# Patient Record
Sex: Female | Born: 1970 | Race: Black or African American | Hispanic: No | Smoking: Never smoker
Health system: Southern US, Community
[De-identification: ages and names within clinical notes are randomized; demographics above are authoritative.]

## PROBLEM LIST (undated history)

## (undated) DIAGNOSIS — N200 Calculus of kidney: Secondary | ICD-10-CM

## (undated) DIAGNOSIS — E119 Type 2 diabetes mellitus without complications: Secondary | ICD-10-CM

## (undated) DIAGNOSIS — M1990 Unspecified osteoarthritis, unspecified site: Secondary | ICD-10-CM

## (undated) DIAGNOSIS — K7689 Other specified diseases of liver: Secondary | ICD-10-CM

## (undated) DIAGNOSIS — I10 Essential (primary) hypertension: Secondary | ICD-10-CM

## (undated) DIAGNOSIS — K8000 Calculus of gallbladder with acute cholecystitis without obstruction: Secondary | ICD-10-CM

## (undated) DIAGNOSIS — Z87442 Personal history of urinary calculi: Secondary | ICD-10-CM

## (undated) HISTORY — PX: KNEE SURGERY: SHX244

## (undated) HISTORY — DX: Other specified diseases of liver: K76.89

## (undated) HISTORY — DX: Calculus of kidney: N20.0

## (undated) HISTORY — PX: ABDOMINAL HYSTERECTOMY: SHX81

## (undated) HISTORY — DX: Calculus of gallbladder with acute cholecystitis without obstruction: K80.00

---

## 2001-07-29 ENCOUNTER — Emergency Department (HOSPITAL_COMMUNITY): Admission: EM | Admit: 2001-07-29 | Discharge: 2001-07-30 | Payer: Self-pay | Admitting: Emergency Medicine

## 2002-02-26 ENCOUNTER — Ambulatory Visit (HOSPITAL_COMMUNITY): Admission: RE | Admit: 2002-02-26 | Discharge: 2002-02-26 | Payer: Self-pay | Admitting: Specialist

## 2003-07-18 ENCOUNTER — Emergency Department (HOSPITAL_COMMUNITY): Admission: EM | Admit: 2003-07-18 | Discharge: 2003-07-18 | Payer: Self-pay | Admitting: Emergency Medicine

## 2003-07-20 ENCOUNTER — Emergency Department (HOSPITAL_COMMUNITY): Admission: EM | Admit: 2003-07-20 | Discharge: 2003-07-20 | Payer: Self-pay | Admitting: Emergency Medicine

## 2003-12-26 ENCOUNTER — Emergency Department (HOSPITAL_COMMUNITY): Admission: EM | Admit: 2003-12-26 | Discharge: 2003-12-27 | Payer: Self-pay | Admitting: Emergency Medicine

## 2003-12-31 ENCOUNTER — Other Ambulatory Visit: Admission: RE | Admit: 2003-12-31 | Discharge: 2003-12-31 | Payer: Self-pay | Admitting: Obstetrics and Gynecology

## 2004-01-16 ENCOUNTER — Inpatient Hospital Stay (HOSPITAL_COMMUNITY): Admission: RE | Admit: 2004-01-16 | Discharge: 2004-01-18 | Payer: Self-pay | Admitting: Obstetrics and Gynecology

## 2004-01-16 ENCOUNTER — Encounter (INDEPENDENT_AMBULATORY_CARE_PROVIDER_SITE_OTHER): Payer: Self-pay | Admitting: Specialist

## 2007-02-23 ENCOUNTER — Emergency Department (HOSPITAL_COMMUNITY): Admission: EM | Admit: 2007-02-23 | Discharge: 2007-02-23 | Payer: Self-pay | Admitting: Emergency Medicine

## 2011-02-11 NOTE — Op Note (Signed)
[facility]  Patient:    Amanda Kirk, Amanda Kirk Visit Number: [id number] MRN: [id number]          Service Type: DSU Location: DAY Attending Physician:  Rocky Crafts Dictated by:   Michael Litter. Montez Morita, M.D. Proc. Date: 02/26/02 Admit Date:  02/26/2002                             Operative Report  PREOPERATIVE DIAGNOSIS:  Torn medial meniscus.  POSTOPERATIVE DIAGNOSIS:  Torn medial meniscus.  OPERATIVE PROCEDURE:  Partial medial meniscectomy.  SURGEON:  Philips J. Montez Morita, M.D.  DESCRIPTION OF PROCEDURE:  After suitable local anesthesia with sedation, the knee was prepped and draped routinely.  Arthroscopy is carried out with an anterolateral portal and the inflow brought in through the scope.  Pertinent pathology is confined to the medial joint where there was a degenerative flap tear on an acute flap tear of the posterior horn of the medial meniscus and two flaps, one tucked up underneath probably causing most of the discomfort, and removed with a combination of rotary meniscotome and the punch forceps until a nice smooth edge remained.  Then, 20 cc of 0.5% Marcaine in the knee and nice pressure dressings, and she goes to recovery in good condition. Dictated by:   [employer] Montez Morita, M.D. Attending Physician:  Rocky Crafts DD:  02/26/02 TD:  02/27/02 Job: [id number]

## 2011-02-11 NOTE — Op Note (Signed)
NAMEGUSTAVA, BERLAND NO.:  [id number]   MEDICAL RECORD NO.:  [id number]                   PATIENT TYPE:  INP   LOCATION:  9307                                 FACILITY:  [facility]   PHYSICIAN:  Carrington Clamp, M.D.              DATE OF BIRTH:  [birth year]   DATE OF PROCEDURE:  01/16/2004  DATE OF DISCHARGE:                                 OPERATIVE REPORT   PREOPERATIVE DIAGNOSES:  1. Menorrhagia with markedly enlarged fibroid uterus.  2. Possible left ovarian dermoid cyst.   POSTOPERATIVE DIAGNOSES:  1. Menorrhagia with markedly enlarged fibroid uterus.  2. Normal left ovarian follicular cysts.   PROCEDURE:  Total abdominal hysterectomy.   ATTENDING:  Carrington Clamp, M.D.   ASSISTANT:  Luvenia Redden, M.D.   ANESTHESIA:  General endotracheal anesthesia.   ESTIMATED BLOOD LOSS:  275 mL.   FLUIDS REPLACED:  2000 mL.   URINE OUTPUT:  200 mL and clear.   COMPLICATIONS:  None.   FINDINGS:  About a 16-week size uterus with multiple rather large fibroids.  There is a small 2-3 cm fibroid in the broad ligament on the left-hand side  close to the ovary, which may have been what had been identified as a  possible dermoid.  The ureters were identified bilaterally.   MEDICATIONS:  Ancef and 0.25% Marcaine.   PATHOLOGY:  Uterus and cervix.   COUNTS:  Correct x3.   TECHNIQUE:  After adequate general anesthesia was achieved, the patient was  prepped and draped in the usual sterile fashion in the dorsal supine  position.  A Pfannenstiel skin incision was made with a scalpel, carried  down to the fascia with Bovie cautery.  The fascia is incised in the midline  with the scalpel and carried in a transverse curvilinear manner with the  Mayo scissors.  The fascia was reflected superiorly and inferiorly from the  rectus muscles.  The rectus muscles were split in the midline.  A bowel-free  portion of peritoneum was entered into bluntly and the  peritoneum was  incised in a superior and inferior manner with the Metzenbaum scissors.   The bowel was packed away and the Balfour retractor was placed.  The uterus  was identified and the round ligaments secured with suture transfixion  stitches of 0 Vicryl.  Each pedicle was incised with the Bovie cautery,  which then also incised the anterior leaf of the broad ligament parallel to  the infundibulopelvic ligament and also the bladder and the vesicouterine  fascia, thus creating the bladder flap.  The bladder was retracted  inferiorly with blunt and sharp dissection.  Each of the broad ligaments was  entered into with the Bovie cautery and Heaneys placed along the uterine-  ovarian ligaments.  Each pedicle was incised with the Mayo scissors and  secured with a free-hand tie of 0 Vicryl followed by a stitch of 0 Vicryl.  The uterine arteries were then skeletonized and each grasped with a Heaney.  Each pedicle was incised with the Mayo scissors and secured with a stitch of  0 Vicryl.  At this point the uterine fundus was amputated with a scalpel.  The right-hand stitch, unfortunately, was interrupted during this process  and the uterine arteries were bleeding rather briskly.  A Heaney clamp was  placed over these vessels and the ureter identified as being close to the  clamp but not in the clamp.  The ureter on the right-hand side was  peristalsing and there was clear urine during the entire procedure.  This  pedicle was then secured with a Heaney stitch of 0 Vicryl.  The ureter was  then again identified close to but not identified in the field of  dissection, and peristalsis was noted.   The rest of the cardinal ligament was then divided with alternating  successive bites of the __________ straight Heaney clamp.  Each pedicle was  incised with the scalpel and then secured with a stitch of 0 Vicryl.  At the  level of the reflection of the vagina onto the cervix, a pair of Heaneys was   placed bilaterally and each pedicle was incised with the Mayo scissors.  The  vagina had been entered into and the angles of the cuff were secured with a  Heaney that included the uterosacral ligament.   The cervix was then amputated with a Jorgenson scissors.  The cuff was  grasped with a pair of Kochers and the cuff was closed with multiple figure-  of-eight stitches of 0 Vicryl.  Hemostasis was achieved once the small  vessel that was on the bladder flap was coagulated.  Irrigation was  performed and the ureters were once again identified bilaterally.   All instruments were then withdrawn from the abdomen.  The peritoneum closed  with a running stitch of 2-0 Vicryl, which also included the rectus muscles  on a separate layer.  The fascia was then closed with a running stitch of 0  PDS.  The subcutaneous tissue was rendered hemostatic with Bovie cautery and  irrigation.  The subcutaneous tissue was brought together with three  interrupted stitches of 2-0 plain gut.  The skin was closed with staples.  The patient tolerated the procedure well, was returned to the recovery room  in stable condition.                                               Carrington Clamp, M.D.    MH/MEDQ  D:  01/16/2004  T:  01/17/2004  Job:  [id number]

## 2011-02-11 NOTE — Discharge Summary (Signed)
[name], Amanda Kirk NO.:  [id number]   MEDICAL RECORD NO.:  [id number]                   PATIENT TYPE:  INP   LOCATION:  9307                                 FACILITY:  [facility]   PHYSICIAN:  Carrington Clamp, M.D.              DATE OF BIRTH:  25-Apr-1971   DATE OF ADMISSION:  01/16/2004  DATE OF DISCHARGE:  01/18/2004                                 DISCHARGE SUMMARY   ADMITTING DIAGNOSIS:  Markedly-enlarged fibroid uterus and menorrhagia.   DISCHARGE DIAGNOSIS:  Markedly-enlarged fibroid uterus and menorrhagia.   PERTINENT PROCEDURES PERFORMED:  Total abdominal hysterectomy.   PERTINENT TEST RESULTS:  Preoperative H&H of 12.6 and 38.8 and postoperative  H&H of 10.7 and 36.4.   HISTORY AND PHYSICAL:  Please refer to dictated History and Physical on  chart but briefly, this is a 39 year old G1 P1 who did not desire future  fertility with markedly-enlarged fibroid uterus and menorrhagia.  The  patient underwent the above-named procedures on January 16, 2004 without  complication.  By January 18, 2004 the patient was eating, ambulating, and  voiding without complication.  The patient was discharged with the  following:   MEDICATIONS:  1. Lortab 5 mg one p.o. q.4-6h. p.r.n. pain.  2. Colace 100 mg one p.o. b.i.d.  3. Phenergan 25 mg one p.o. q.4-6h. p.r.n. nausea/vomiting.   ACTIVITY:  No heavy lifting or straining x6 weeks, pelvic rest x6 weeks, no  driving x2 weeks.   DIET:  High fiber, high water.   WOUND CARE:  Shower only.   FOLLOW-UP:  The following Monday for staples out.                                               Carrington Clamp, M.D.    MH/MEDQ  D:  02/17/2004  T:  02/17/2004  Job:  [id number]

## 2011-02-11 NOTE — H&P (Signed)
NAME:  Amanda Kirk, Amanda Kirk NO.:  [id number]   MEDICAL RECORD NO.:  [id number]                   PATIENT TYPE:  INP   LOCATION:  NA                                   FACILITY:  [facility]   PHYSICIAN:  Carrington Clamp, M.D.              DATE OF BIRTH:  05-Oct-1970   DATE OF ADMISSION:  DATE OF DISCHARGE:                                HISTORY & PHYSICAL   DATE OF SCHEDULED SURGERY:  January 16, 2004   CHIEF COMPLAINT:  This is a 40 year old G1 P1 who does not desire future  fertility with a markedly enlarged fibroid uterus and menorrhagia.   HISTORY OF PRESENT ILLNESS:  The patient presented to me December 31, 2003  complaining of a knot in her abdomen on the left side for about 6 months.  Her periods have become extremely painful to the point where she cannot  walk.  She has extreme cramps and menorrhagia for 5 days with blood clots.  She uses boxes of pads at a time.  She needs help at work because of her  horrible periods.  They have been worsening over the last 6-7 months.  Ultrasound at the ER that was done because she went there for pain showed  approximately a 15- to 16-week-size uterus with multiple fibroids and the  left ovary that possibly has a dermoid cyst.  After extensive discussion  with the patient over the alternatives and benefits of every type of  procedure, the patient elected to undergo total abdominal hysterectomy with  possible left salpingo-oophorectomy if there is a dermoid on that side.   PAST MEDICAL HISTORY:  None.   PAST SURGICAL HISTORY:  Knee surgery only.   PAST OBSTETRICAL HISTORY:  Term spontaneous vaginal delivery x1.   PAST GYNECOLOGICAL HISTORY:  No sexually-transmitted diseases, pelvic  infections, or abnormal Pap smears.   The patient has no known drug allergies but does complain of nausea and  vomiting with PERCOCET and CODEINE.   MEDICATIONS:  None.   The patient does not smoke tobacco.   PHYSICAL EXAMINATION:   GENERAL:  Shows a normal, well-nourished patient,  alert and oriented.  NECK:  Without thyromegaly or lymphadenopathy.  HEART:  Regular rate and rhythm.  LUNGS:  Clear to auscultation bilaterally.  ABDOMEN:  Soft, nontender, but apparently the fibroid uterus can be felt  through the abdominal wall.  BREASTS:  Normal.  PELVIC:  Revealed normal external genitalia, normal vault and vagina.  The  cervix was not mobile, it was stuck.  Uterus was approximately 15- to 16-  week size in the mid presentation.  Adnexa without palpable masses.  Bladder  and anus were normal.  Guaiac was not done.   Pelvic ultrasound revealed an 11 x 7 x 8 cm uterus with multiple fibroids,  the largest of which was 6 x 5 x 5.  There were multiple other fibroids.  Right ovary appeared  normal.  Left ovary was 4 x 3 with small echogenic  nodules which may represent hemorrhagic corpus luteum cyst or dermoid.   ASSESSMENT:  This is a 40 year old who does not desire future fertility  whose husband has had a vasectomy who desires definitive therapy for her  enlarging fibroid uterus which is 15- to 16-week size with severe  menorrhagia and dysmenorrhea.  The patient has been counseled about the  risks, benefits, and alternatives to all procedures and elects to undergo  total abdominal hysterectomy with possible left salpingo-oophorectomy if the  left ovary is abnormal.  The patient will receive preoperative antibiotics  and SCDs during the surgery.                                               Carrington Clamp, M.D.    MH/MEDQ  D:  01/15/2004  T:  01/15/2004  Job:  [id number]   cc:   Day Surgery

## 2012-07-23 ENCOUNTER — Encounter (HOSPITAL_COMMUNITY): Payer: Self-pay | Admitting: Emergency Medicine

## 2012-07-23 ENCOUNTER — Observation Stay (HOSPITAL_COMMUNITY)
Admission: EM | Admit: 2012-07-23 | Discharge: 2012-07-25 | Disposition: A | Discharge status: Home / Self Care | Attending: General Surgery | Admitting: General Surgery

## 2012-07-23 ENCOUNTER — Emergency Department (HOSPITAL_COMMUNITY)

## 2012-07-23 DIAGNOSIS — K8020 Calculus of gallbladder without cholecystitis without obstruction: Secondary | ICD-10-CM

## 2012-07-23 DIAGNOSIS — K8000 Calculus of gallbladder with acute cholecystitis without obstruction: Principal | ICD-10-CM | POA: Insufficient documentation

## 2012-07-23 DIAGNOSIS — K7689 Other specified diseases of liver: Secondary | ICD-10-CM | POA: Insufficient documentation

## 2012-07-23 DIAGNOSIS — N200 Calculus of kidney: Secondary | ICD-10-CM | POA: Insufficient documentation

## 2012-07-23 DIAGNOSIS — Z23 Encounter for immunization: Secondary | ICD-10-CM | POA: Insufficient documentation

## 2012-07-23 DIAGNOSIS — K8010 Calculus of gallbladder with chronic cholecystitis without obstruction: Secondary | ICD-10-CM

## 2012-07-23 HISTORY — DX: Calculus of kidney: N20.0

## 2012-07-23 LAB — CBC WITH DIFFERENTIAL/PLATELET
Basophils Absolute: 0 10*3/uL (ref 0.0–0.1)
Eosinophils Absolute: 0.1 10*3/uL (ref 0.0–0.7)
HCT: 39.1 % (ref 36.0–46.0)
Hemoglobin: 13.3 g/dL (ref 12.0–15.0)
Lymphocytes Relative: 34 % (ref 12–46)
MCV: 82 fL (ref 78.0–100.0)
RBC: 4.77 MIL/uL (ref 3.87–5.11)
RDW: 12.8 % (ref 11.5–15.5)

## 2012-07-23 LAB — URINALYSIS, MICROSCOPIC ONLY
Bilirubin Urine: NEGATIVE
Glucose, UA: NEGATIVE mg/dL
Hgb urine dipstick: NEGATIVE
Ketones, ur: NEGATIVE mg/dL
Leukocytes, UA: NEGATIVE
Nitrite: NEGATIVE
Protein, ur: NEGATIVE mg/dL
Specific Gravity, Urine: 1.017 (ref 1.005–1.030)
Urobilinogen, UA: 1 mg/dL (ref 0.0–1.0)
pH: 6.5 (ref 5.0–8.0)

## 2012-07-23 LAB — COMPREHENSIVE METABOLIC PANEL
ALT: 20 U/L (ref 0–35)
AST: 22 U/L (ref 0–37)
Alkaline Phosphatase: 106 U/L (ref 39–117)
CO2: 25 mEq/L (ref 19–32)
Calcium: 9.8 mg/dL (ref 8.4–10.5)
Chloride: 98 mEq/L (ref 96–112)
GFR calc non Af Amer: >90 mL/min (ref >90)
Potassium: 3.5 mEq/L (ref 3.5–5.1)
Total Bilirubin: 0.2 mg/dL — ABNORMAL LOW (ref 0.3–1.2)

## 2012-07-23 MED ORDER — ONDANSETRON HCL 4 MG/2ML IJ SOLN: 4.0000 mg | Freq: Four times a day (QID) | INTRAMUSCULAR | Status: DC | PRN

## 2012-07-23 MED ORDER — ONDANSETRON HCL 4 MG/2ML IJ SOLN
4.0000 mg | Freq: Once | INTRAMUSCULAR | Status: AC
  Administered 2012-07-23: 4 mg via INTRAVENOUS
  Filled 2012-07-23: qty 2

## 2012-07-23 MED ORDER — SODIUM CHLORIDE 0.9 % IV SOLN
3.0000 g | Freq: Four times a day (QID) | INTRAVENOUS | Status: DC
  Administered 2012-07-23 – 2012-07-24 (×4): 3 g via INTRAVENOUS
  Filled 2012-07-23 (×3): qty 3

## 2012-07-23 MED ORDER — MORPHINE SULFATE 2 MG/ML IJ SOLN
2.0000 mg | INTRAMUSCULAR | Status: DC | PRN
  Administered 2012-07-24: 2 mg via INTRAVENOUS
  Filled 2012-07-23: qty 1

## 2012-07-23 MED ORDER — MORPHINE SULFATE 2 MG/ML IJ SOLN
2.0000 mg | Freq: Once | INTRAMUSCULAR | Status: AC
  Administered 2012-07-23: 2 mg via INTRAVENOUS
  Filled 2012-07-23: qty 1

## 2012-07-23 MED ORDER — GI COCKTAIL ~~LOC~~
30.0000 mL | Freq: Once | ORAL | Status: AC
  Administered 2012-07-23: 30 mL via ORAL
  Filled 2012-07-23: qty 30

## 2012-07-23 MED ORDER — SODIUM CHLORIDE 0.9 % IV SOLN
1000.0000 mL | Freq: Once | INTRAVENOUS | Status: AC
  Administered 2012-07-23: 1000 mL via INTRAVENOUS

## 2012-07-23 MED ORDER — ONDANSETRON HCL 4 MG/2ML IJ SOLN
4.0000 mg | Freq: Once | INTRAMUSCULAR | Status: AC
  Administered 2012-07-23: 4 mg via INTRAVENOUS
  Filled 2012-07-23 (×2): qty 2

## 2012-07-23 NOTE — Progress Notes (Signed)
Pt veteran's administration coverage via husband (who goes to [facility] for pcp services) Pt reports( not needing prior to this) services but would go to [facility] if needed CM discussed with them [facility] and [facility]

## 2012-07-23 NOTE — ED Notes (Signed)
Pt presenting to ed with c/o abdominal pain x 4 days with positive nausea no vomiting or diarrhea. Pt states last normal bowel movement yesterday. Pt states pain is mid-epigastric.

## 2012-07-23 NOTE — ED Provider Notes (Signed)
History     CSN: [id number]  Arrival date & time 07/23/12  1450   First MD Initiated Contact with Patient 07/23/12 1628      Chief Complaint  Patient presents with  . Abdominal Pain    (Consider location/radiation/quality/duration/timing/severity/associated sxs/prior treatment) HPI Pt has had 4 days of episodic epigastric pain with nausea after eating. Resolves spontaneously. Associated with nausea. Pt began having same pain today around 1300 today not associated with food and has persisted to ED presentation. No Vomiting, diarrhea. No blood in stool. No melena. Prev hysterectomy but no other abd surgeries. No fever or chills or urinary symptoms Past Medical History  Diagnosis Date  . Kidney stones     Past Surgical History  Procedure Date  . Abdominal hysterectomy   . Knee surgery     x 2    No family history on file.  History  Substance Use Topics  . Smoking status: Never Smoker   . Smokeless tobacco: Not on file  . Alcohol Use:      occassionally    OB History    Grav Para Term Preterm Abortions TAB SAB Ect Mult Living                  Review of Systems  Constitutional: Negative for fever and chills.  Respiratory: Negative for cough and shortness of breath.   Cardiovascular: Negative for chest pain, palpitations and leg swelling.  Gastrointestinal: Positive for nausea and abdominal pain. Negative for vomiting, diarrhea, constipation, blood in stool and abdominal distention.  Genitourinary: Negative for dysuria, frequency and flank pain.  Musculoskeletal: Negative for myalgias and back pain.  Skin: Negative for rash and wound.  Neurological: Negative for dizziness, weakness, numbness and headaches.    Allergies  Codeine  Home Medications   Current Outpatient Rx  Name Route Sig Dispense Refill  . LORATADINE 10 MG PO TABS Oral Take 10 mg by mouth daily.      BP 125/67  Pulse 60  Temp 98.5 F (36.9 C) (Oral)  Resp 18  SpO2 100%  Physical Exam    Nursing note and vitals reviewed. Constitutional: She is oriented to person, place, and time. She appears well-developed and well-nourished. No distress.  HENT:  Head: Normocephalic and atraumatic.  Mouth/Throat: Oropharynx is clear and moist.  Eyes: EOM are normal. Pupils are equal, round, and reactive to light.  Neck: Normal range of motion. Neck supple.  Cardiovascular: Normal rate and regular rhythm.   Pulmonary/Chest: Effort normal and breath sounds normal. No respiratory distress. She has no wheezes. She has no rales.  Abdominal: Soft. Bowel sounds are normal. There is tenderness (TTP over epigastrum. No rebound or guarding. Mild RUQ TTP). There is no rebound and no guarding.  Musculoskeletal: Normal range of motion. She exhibits no edema and no tenderness.  Neurological: She is alert and oriented to person, place, and time.  Skin: Skin is warm and dry. No rash noted. No erythema.  Psychiatric: She has a normal mood and affect. Her behavior is normal.    ED Course  Procedures (including critical care time)  Labs Reviewed  COMPREHENSIVE METABOLIC PANEL - Abnormal; Notable for the following:    Sodium 134 (*)     Glucose, Bld 167 (*)     Total Bilirubin 0.2 (*)     All other components within normal limits  CBC WITH DIFFERENTIAL  LIPASE, BLOOD  URINALYSIS, MICROSCOPIC ONLY   US Abdomen Complete  07/23/2012  *RADIOLOGY REPORT*  Clinical  Data:  Abdominal pain.  COMPLETE ABDOMINAL ULTRASOUND  Comparison:  CT of the abdomen pelvis without contrast 02/23/2007.  Findings:  Gallbladder:  Gallbladder wall is thickened measuring at least 4.4 mm.  There is minimal pericholecystic fluid as well.  A shadowing stone at the neck of the gallbladder measures at least 1.3 cm. There is no sonographic Murphy's sign.  Common bile duct:  Normal in caliber. No biliary ductal dilation. The maximal diameter is 3.9 mm.  Liver:  There is loss of normal internal echotexture with decreased increased  echogenicity.  No focal lesions are evident.  IVC:  Appears normal.  Pancreas:  No focal abnormality seen.  Spleen:  Normal size and echotexture without focal parenchymal abnormality.  The maximal length is 7.9 cm.  Right Kidney:  No hydronephrosis.  Well-preserved cortex.  Normal size and parenchymal echotexture without focal abnormalities. The maximal length is 10.1 cm, within normal limits.  Left Kidney:  Multiple shadowing stones are present.  The largest is a 6 mm stone in the midportion of the left kidney.  There is three additional stones range from 4.0-5.1 mm also in the midportion.  There is no obstruction or hydronephrosis.  Abdominal aorta:  The distal aorta bifurcation is not visualized due to bowel gas.  IMPRESSION:  1.  Gallbladder wall thickening and large gallstone in the neck of the gallbladder.  Although there is no sonographic Murphy's sign, early cholecystitis is considered. 2.  Nonobstructive left nephrolithiasis. 3.  Hepatic steatosis.   Original Report Authenticated By: Jamesetta Orleans. MATTERN, M.D.      1. Symptomatic cholelithiasis       MDM  Discussed with surgery. Will see in ED and admit        Loren Racer, MD 07/23/12 2244

## 2012-07-23 NOTE — H&P (Signed)
Amanda Kirk is an 41 y.o. female.   Chief Complaint: ab pain, consult from Dr. August Luz HPI: 48 yof who has had prior history of intermittent abdominal pain but in last week or so has increased in frequency and severity.  Not sure if related to eating and always went away.  Today she developed ruq and epigastric pain that has not relented.  She has nausea but no emesis.  She is having bowel movements and passing flatus.  She has no fever.  She has received pain medication in er but still has pain and hurts to move.  Past Medical History  Diagnosis Date  . Kidney stones     Past Surgical History  Procedure Date  . Abdominal hysterectomy   . Knee surgery     x 2    No family history on file. Social History:  reports that she has never smoked. She does not have any smokeless tobacco history on file. She reports that she does not use illicit drugs. Her alcohol history not on file.  Allergies:  Allergies  Allergen Reactions  . Codeine Nausea And Vomiting    Meds claritin  Results for orders placed during the hospital encounter of 07/23/12 (from the past 48 hour(s))  URINALYSIS, MICROSCOPIC ONLY     Status: Normal   Collection Time   07/23/12  4:23 PM      Component Value Range Comment   Color, Urine YELLOW  YELLOW    APPearance CLEAR  CLEAR    Specific Gravity, Urine 1.017  1.005 - 1.030    pH 6.5  5.0 - 8.0    Glucose, UA NEGATIVE  NEGATIVE mg/dL    Hgb urine dipstick NEGATIVE  NEGATIVE    Bilirubin Urine NEGATIVE  NEGATIVE    Ketones, ur NEGATIVE  NEGATIVE mg/dL    Protein, ur NEGATIVE  NEGATIVE mg/dL    Urobilinogen, UA 1.0  0.0 - 1.0 mg/dL    Nitrite NEGATIVE  NEGATIVE    Leukocytes, UA NEGATIVE  NEGATIVE    WBC, UA 0-2  <3 WBC/hpf    RBC / HPF 0-2  <3 RBC/hpf    Squamous Epithelial / LPF RARE  RARE    Urine-Other MUCOUS PRESENT     CBC WITH DIFFERENTIAL     Status: Normal   Collection Time   07/23/12  4:50 PM      Component Value Range Comment   WBC 5.5  4.0  - 10.5 K/uL    RBC 4.77  3.87 - 5.11 MIL/uL    Hemoglobin 13.3  12.0 - 15.0 g/dL    HCT 16.1  09.6 - 04.5 %    MCV 82.0  78.0 - 100.0 fL    MCH 27.9  26.0 - 34.0 pg    MCHC 34.0  30.0 - 36.0 g/dL    RDW 40.9  81.1 - 91.4 %    Platelets 318  150 - 400 K/uL    Neutrophils Relative 60  43 - 77 %    Neutro Abs 3.3  1.7 - 7.7 K/uL    Lymphocytes Relative 34  12 - 46 %    Lymphs Abs 1.9  0.7 - 4.0 K/uL    Monocytes Relative 5  3 - 12 %    Monocytes Absolute 0.3  0.1 - 1.0 K/uL    Eosinophils Relative 1  0 - 5 %    Eosinophils Absolute 0.1  0.0 - 0.7 K/uL    Basophils Relative 0  0 -  1 %    Basophils Absolute 0.0  0.0 - 0.1 K/uL   COMPREHENSIVE METABOLIC PANEL     Status: Abnormal   Collection Time   07/23/12  4:50 PM      Component Value Range Comment   Sodium 134 (*) 135 - 145 mEq/L    Potassium 3.5  3.5 - 5.1 mEq/L    Chloride 98  96 - 112 mEq/L    CO2 25  19 - 32 mEq/L    Glucose, Bld 167 (*) 70 - 99 mg/dL    BUN 8  6 - 23 mg/dL    Creatinine, Ser 9.60  0.50 - 1.10 mg/dL    Calcium 9.8  8.4 - 45.4 mg/dL    Total Protein 7.6  6.0 - 8.3 g/dL    Albumin 3.8  3.5 - 5.2 g/dL    AST 22  0 - 37 U/L    ALT 20  0 - 35 U/L    Alkaline Phosphatase 106  39 - 117 U/L    Total Bilirubin 0.2 (*) 0.3 - 1.2 mg/dL    GFR calc non Af Amer >90  >90 mL/min    GFR calc Af Amer >90  >90 mL/min   LIPASE, BLOOD     Status: Normal   Collection Time   07/23/12  4:50 PM      Component Value Range Comment   Lipase 25  11 - 59 U/L    US Abdomen Complete  07/23/2012  *RADIOLOGY REPORT*  Clinical Data:  Abdominal pain.  COMPLETE ABDOMINAL ULTRASOUND  Comparison:  CT of the abdomen pelvis without contrast 02/23/2007.  Findings:  Gallbladder:  Gallbladder wall is thickened measuring at least 4.4 mm.  There is minimal pericholecystic fluid as well.  A shadowing stone at the neck of the gallbladder measures at least 1.3 cm. There is no sonographic Murphy's sign.  Common bile duct:  Normal in caliber. No  biliary ductal dilation. The maximal diameter is 3.9 mm.  Liver:  There is loss of normal internal echotexture with decreased increased echogenicity.  No focal lesions are evident.  IVC:  Appears normal.  Pancreas:  No focal abnormality seen.  Spleen:  Normal size and echotexture without focal parenchymal abnormality.  The maximal length is 7.9 cm.  Right Kidney:  No hydronephrosis.  Well-preserved cortex.  Normal size and parenchymal echotexture without focal abnormalities. The maximal length is 10.1 cm, within normal limits.  Left Kidney:  Multiple shadowing stones are present.  The largest is a 6 mm stone in the midportion of the left kidney.  There is three additional stones range from 4.0-5.1 mm also in the midportion.  There is no obstruction or hydronephrosis.  Abdominal aorta:  The distal aorta bifurcation is not visualized due to bowel gas.  IMPRESSION:  1.  Gallbladder wall thickening and large gallstone in the neck of the gallbladder.  Although there is no sonographic Murphy's sign, early cholecystitis is considered. 2.  Nonobstructive left nephrolithiasis. 3.  Hepatic steatosis.   Original Report Authenticated By: Jamesetta Orleans. MATTERN, M.D.     Review of Systems  Cardiovascular: Negative for chest pain.  Gastrointestinal: Positive for nausea and abdominal pain. Negative for vomiting, diarrhea and constipation.    Blood pressure 125/67, pulse 60, temperature 98.5 F (36.9 C), temperature source Oral, resp. rate 18, SpO2 100.00%. Physical Exam  Vitals reviewed. Constitutional: She appears well-developed and well-nourished.  Eyes: No scleral icterus.  Cardiovascular: Normal rate, regular rhythm and normal heart  sounds.   Respiratory: Effort normal and breath sounds normal. No respiratory distress. She has no wheezes. She has no rales.  GI: Soft. Bowel sounds are normal. She exhibits no distension. There is tenderness in the right upper quadrant and epigastric area.      Assessment/Plan Symptomatic cholelithiasis  I think due to persistence of pain she would benefit from admission and cholecystectomy tomorrow.   I discussed the procedure in detail.  We discussed the risks and benefits of a laparoscopic cholecystectomy and possible cholangiogram including, but not limited to bleeding, infection, injury to surrounding structures such as the intestine or liver, bile leak,  need to convert to an open procedure, prolonged diarrhea.  The likelihood of improvement in symptoms and return to the patient's normal status is good. We discussed the typical post-operative recovery course.   Kaspar Albornoz 07/23/2012, 10:14 PM

## 2012-07-23 NOTE — ED Notes (Signed)
Surgeon at bedside.  

## 2012-07-24 ENCOUNTER — Observation Stay (HOSPITAL_COMMUNITY)

## 2012-07-24 ENCOUNTER — Observation Stay (HOSPITAL_COMMUNITY): Admitting: Anesthesiology

## 2012-07-24 ENCOUNTER — Encounter (HOSPITAL_COMMUNITY): Payer: Self-pay | Admitting: Anesthesiology

## 2012-07-24 ENCOUNTER — Encounter (HOSPITAL_COMMUNITY)
Admission: EM | Disposition: A | Discharge status: Home / Self Care | Payer: Self-pay | Source: Home / Self Care | Attending: Emergency Medicine

## 2012-07-24 HISTORY — PX: CHOLECYSTECTOMY: SHX55

## 2012-07-24 LAB — CBC
MCH: 27.7 pg (ref 26.0–34.0)
MCHC: 33.4 g/dL (ref 30.0–36.0)
Platelets: 284 10*3/uL (ref 150–400)

## 2012-07-24 LAB — COMPREHENSIVE METABOLIC PANEL
ALT: 17 U/L (ref 0–35)
AST: 16 U/L (ref 0–37)
CO2: 26 mEq/L (ref 19–32)
Chloride: 100 mEq/L (ref 96–112)
Creatinine, Ser: 0.92 mg/dL (ref 0.50–1.10)
GFR calc Af Amer: 88 mL/min — ABNORMAL LOW (ref >90)
GFR calc non Af Amer: 76 mL/min — ABNORMAL LOW (ref >90)
Glucose, Bld: 96 mg/dL (ref 70–99)
Total Bilirubin: 0.5 mg/dL (ref 0.3–1.2)

## 2012-07-24 LAB — SURGICAL PCR SCREEN
MRSA, PCR: NEGATIVE
Staphylococcus aureus: NEGATIVE

## 2012-07-24 SURGERY — LAPAROSCOPIC CHOLECYSTECTOMY WITH INTRAOPERATIVE CHOLANGIOGRAM
Anesthesia: General | Site: Abdomen | Wound class: Clean

## 2012-07-24 MED ORDER — LACTATED RINGERS IV SOLN
INTRAVENOUS | Status: DC | PRN
  Administered 2012-07-24: 1000 mL via INTRAVENOUS

## 2012-07-24 MED ORDER — ACETAMINOPHEN 325 MG PO TABS: 650.0000 mg | ORAL_TABLET | Freq: Four times a day (QID) | ORAL | Status: DC | PRN

## 2012-07-24 MED ORDER — BUPIVACAINE HCL (PF) 0.25 % IJ SOLN
INTRAMUSCULAR | Status: AC
  Filled 2012-07-24: qty 30

## 2012-07-24 MED ORDER — ONDANSETRON HCL 4 MG/2ML IJ SOLN
4.0000 mg | Freq: Four times a day (QID) | INTRAMUSCULAR | Status: DC | PRN
  Administered 2012-07-24: 4 mg via INTRAVENOUS
  Filled 2012-07-24: qty 2

## 2012-07-24 MED ORDER — ENOXAPARIN SODIUM 40 MG/0.4ML ~~LOC~~ SOLN
40.0000 mg | SUBCUTANEOUS | Status: DC
  Filled 2012-07-24 (×3): qty 0.4

## 2012-07-24 MED ORDER — LIDOCAINE-EPINEPHRINE (PF) 1 %-1:200000 IJ SOLN
INTRAMUSCULAR | Status: DC | PRN
  Administered 2012-07-24: 40 mL

## 2012-07-24 MED ORDER — SUCCINYLCHOLINE CHLORIDE 20 MG/ML IJ SOLN
INTRAMUSCULAR | Status: DC | PRN
  Administered 2012-07-24: 100 mg via INTRAVENOUS

## 2012-07-24 MED ORDER — MIDAZOLAM HCL 5 MG/5ML IJ SOLN
INTRAMUSCULAR | Status: DC | PRN
  Administered 2012-07-24: 2 mg via INTRAVENOUS

## 2012-07-24 MED ORDER — GLYCOPYRROLATE 0.2 MG/ML IJ SOLN
INTRAMUSCULAR | Status: DC | PRN
  Administered 2012-07-24: 0.4 mg via INTRAVENOUS

## 2012-07-24 MED ORDER — PROMETHAZINE HCL 25 MG/ML IJ SOLN: 6.2500 mg | INTRAMUSCULAR | Status: DC | PRN

## 2012-07-24 MED ORDER — MEPERIDINE HCL 50 MG/ML IJ SOLN: 6.2500 mg | INTRAMUSCULAR | Status: DC | PRN

## 2012-07-24 MED ORDER — ROCURONIUM BROMIDE 100 MG/10ML IV SOLN
INTRAVENOUS | Status: DC | PRN
  Administered 2012-07-24: 30 mg via INTRAVENOUS

## 2012-07-24 MED ORDER — IOHEXOL 300 MG/ML  SOLN
INTRAMUSCULAR | Status: AC
  Filled 2012-07-24: qty 1

## 2012-07-24 MED ORDER — INFLUENZA VIRUS VACC SPLIT PF IM SUSP: 0.5000 mL | INTRAMUSCULAR | Status: DC

## 2012-07-24 MED ORDER — PROMETHAZINE HCL 25 MG/ML IJ SOLN
6.2500 mg | Freq: Four times a day (QID) | INTRAMUSCULAR | Status: DC | PRN
  Administered 2012-07-24: 12.5 mg via INTRAVENOUS
  Filled 2012-07-24: qty 1

## 2012-07-24 MED ORDER — LACTATED RINGERS IV SOLN
INTRAVENOUS | Status: DC | PRN
  Administered 2012-07-24 (×2): via INTRAVENOUS

## 2012-07-24 MED ORDER — BUPIVACAINE-EPINEPHRINE PF 0.25-1:200000 % IJ SOLN
INTRAMUSCULAR | Status: AC
  Filled 2012-07-24: qty 30

## 2012-07-24 MED ORDER — LIDOCAINE HCL (CARDIAC) 20 MG/ML IV SOLN
INTRAVENOUS | Status: DC | PRN
  Administered 2012-07-24: 50 mg via INTRAVENOUS

## 2012-07-24 MED ORDER — ACETAMINOPHEN 650 MG RE SUPP: 650.0000 mg | Freq: Four times a day (QID) | RECTAL | Status: DC | PRN

## 2012-07-24 MED ORDER — ACETAMINOPHEN 10 MG/ML IV SOLN
INTRAVENOUS | Status: DC | PRN
  Administered 2012-07-24: 1000 mg via INTRAVENOUS

## 2012-07-24 MED ORDER — NEOSTIGMINE METHYLSULFATE 1 MG/ML IJ SOLN
INTRAMUSCULAR | Status: DC | PRN
  Administered 2012-07-24: 3 mg via INTRAVENOUS

## 2012-07-24 MED ORDER — SODIUM CHLORIDE 0.9 % IR SOLN
Status: DC | PRN
  Administered 2012-07-24: 4 mL

## 2012-07-24 MED ORDER — MORPHINE SULFATE 2 MG/ML IJ SOLN
1.0000 mg | INTRAMUSCULAR | Status: DC | PRN
Start: 2012-07-24 — End: 2012-07-25
  Administered 2012-07-24: 2 mg via INTRAVENOUS
  Administered 2012-07-24 (×2): 4 mg via INTRAVENOUS
  Administered 2012-07-24: 2 mg via INTRAVENOUS
  Administered 2012-07-25 (×2): 4 mg via INTRAVENOUS
  Filled 2012-07-24: qty 2
  Filled 2012-07-24: qty 1
  Filled 2012-07-24 (×3): qty 2
  Filled 2012-07-24: qty 1

## 2012-07-24 MED ORDER — BUPIVACAINE HCL (PF) 0.25 % IJ SOLN
INTRAMUSCULAR | Status: DC | PRN
  Administered 2012-07-24: 40 mL

## 2012-07-24 MED ORDER — DEXAMETHASONE SODIUM PHOSPHATE 10 MG/ML IJ SOLN
INTRAMUSCULAR | Status: DC | PRN
  Administered 2012-07-24: 10 mg via INTRAVENOUS

## 2012-07-24 MED ORDER — LIDOCAINE-EPINEPHRINE (PF) 1 %-1:200000 IJ SOLN
INTRAMUSCULAR | Status: AC
  Filled 2012-07-24: qty 10

## 2012-07-24 MED ORDER — ONDANSETRON HCL 4 MG/2ML IJ SOLN
INTRAMUSCULAR | Status: DC | PRN
  Administered 2012-07-24: 4 mg via INTRAVENOUS

## 2012-07-24 MED ORDER — FENTANYL CITRATE 0.05 MG/ML IJ SOLN: 25.0000 ug | INTRAMUSCULAR | Status: DC | PRN

## 2012-07-24 MED ORDER — FENTANYL CITRATE 0.05 MG/ML IJ SOLN
INTRAMUSCULAR | Status: DC | PRN
  Administered 2012-07-24: 50 ug via INTRAVENOUS
  Administered 2012-07-24: 100 ug via INTRAVENOUS
  Administered 2012-07-24: 50 ug via INTRAVENOUS

## 2012-07-24 MED ORDER — KCL IN DEXTROSE-NACL 20-5-0.45 MEQ/L-%-% IV SOLN
INTRAVENOUS | Status: DC
  Administered 2012-07-24 – 2012-07-25 (×3): via INTRAVENOUS
  Filled 2012-07-24 (×3): qty 1000

## 2012-07-24 MED ORDER — LACTATED RINGERS IV SOLN: INTRAVENOUS | Status: DC

## 2012-07-24 MED ORDER — PROPOFOL 10 MG/ML IV BOLUS
INTRAVENOUS | Status: DC | PRN
  Administered 2012-07-24: 180 mg via INTRAVENOUS

## 2012-07-24 MED ORDER — SODIUM CHLORIDE 0.9 % IV SOLN
INTRAVENOUS | Status: DC
  Administered 2012-07-24: 01:00:00 via INTRAVENOUS

## 2012-07-24 MED ORDER — ACETAMINOPHEN 10 MG/ML IV SOLN
INTRAVENOUS | Status: AC
  Filled 2012-07-24: qty 100

## 2012-07-24 MED ORDER — IOHEXOL 300 MG/ML  SOLN
INTRAMUSCULAR | Status: DC | PRN
  Administered 2012-07-24: 4 mL via INTRAVENOUS

## 2012-07-24 MED ORDER — ONDANSETRON HCL 4 MG PO TABS: 4.0000 mg | ORAL_TABLET | Freq: Four times a day (QID) | ORAL | Status: DC | PRN

## 2012-07-24 SURGICAL SUPPLY — 40 items
APPLICATOR COTTON TIP 6IN STRL (MISCELLANEOUS) ×4 IMPLANT
APPLIER CLIP LOGIC TI 5 (MISCELLANEOUS) IMPLANT
APPLIER CLIP ROT 10 11.4 M/L (STAPLE) ×4
CABLE HIGH FREQUENCY MONO STRZ (ELECTRODE) ×2 IMPLANT
CANISTER SUCTION 2500CC (MISCELLANEOUS) ×2 IMPLANT
CHLORAPREP W/TINT 26ML (MISCELLANEOUS) ×2 IMPLANT
CLIP APPLIE ROT 10 11.4 M/L (STAPLE) ×2 IMPLANT
CLOTH BEACON ORANGE TIMEOUT ST (SAFETY) ×2 IMPLANT
COVER SURGICAL LIGHT HANDLE (MISCELLANEOUS) ×2 IMPLANT
DECANTER SPIKE VIAL GLASS SM (MISCELLANEOUS) ×2 IMPLANT
DERMABOND ADVANCED (GAUZE/BANDAGES/DRESSINGS) ×1
DERMABOND ADVANCED .7 DNX12 (GAUZE/BANDAGES/DRESSINGS) ×1 IMPLANT
DRAPE C-ARM 42X72 X-RAY (DRAPES) ×2 IMPLANT
DRAPE LAPAROSCOPIC ABDOMINAL (DRAPES) ×2 IMPLANT
ELECT REM PT RETURN 9FT ADLT (ELECTROSURGICAL) ×2
ELECTRODE REM PT RTRN 9FT ADLT (ELECTROSURGICAL) ×1 IMPLANT
ENDOLOOP SUT PDS II  0 18 (SUTURE) ×1
ENDOLOOP SUT PDS II 0 18 (SUTURE) ×1 IMPLANT
GLOVE BIOGEL PI IND STRL 6.5 (GLOVE) ×1 IMPLANT
GLOVE BIOGEL PI INDICATOR 6.5 (GLOVE) ×1
GLOVE SURG SS PI 7.0 STRL IVOR (GLOVE) ×2 IMPLANT
GLOVE SURG SS PI 7.5 STRL IVOR (GLOVE) ×4 IMPLANT
GOWN STRL NON-REIN LRG LVL3 (GOWN DISPOSABLE) ×4 IMPLANT
GOWN STRL REIN XL XLG (GOWN DISPOSABLE) ×4 IMPLANT
KIT BASIN OR (CUSTOM PROCEDURE TRAY) ×2 IMPLANT
NS IRRIG 1000ML POUR BTL (IV SOLUTION) ×2 IMPLANT
PENCIL BUTTON HOLSTER BLD 10FT (ELECTRODE) ×2 IMPLANT
POUCH SPECIMEN RETRIEVAL 10MM (ENDOMECHANICALS) ×2 IMPLANT
SCISSORS LAP 5X35 DISP (ENDOMECHANICALS) IMPLANT
SET CHOLANGIOGRAPH MIX (MISCELLANEOUS) ×2 IMPLANT
SET IRRIG TUBING LAPAROSCOPIC (IRRIGATION / IRRIGATOR) ×2 IMPLANT
STRIP CLOSURE SKIN 1/2X4 (GAUZE/BANDAGES/DRESSINGS) IMPLANT
SUT MNCRL AB 4-0 PS2 18 (SUTURE) ×2 IMPLANT
SUT VICRYL 0 UR6 27IN ABS (SUTURE) ×2 IMPLANT
TOWEL OR 17X26 10 PK STRL BLUE (TOWEL DISPOSABLE) ×2 IMPLANT
TRAY LAP CHOLE (CUSTOM PROCEDURE TRAY) ×2 IMPLANT
TROCAR BALLN 12MMX100 BLUNT (TROCAR) ×2 IMPLANT
TROCAR BLADELESS OPT 5 75 (ENDOMECHANICALS) ×4 IMPLANT
TROCAR XCEL NON-BLD 11X100MML (ENDOMECHANICALS) ×2 IMPLANT
TUBING INSUFFLATION 10FT LAP (TUBING) ×2 IMPLANT

## 2012-07-24 NOTE — Transfer of Care (Signed)
Immediate Anesthesia Transfer of Care Note  Patient: Amanda Kirk  Procedure(s) Performed: Procedure(s) (LRB) with comments: LAPAROSCOPIC CHOLECYSTECTOMY WITH INTRAOPERATIVE CHOLANGIOGRAM (N/A)  Patient Location: PACU  Anesthesia Type:General  Level of Consciousness: sedated  Airway & Oxygen Therapy: Patient Spontanous Breathing and Patient connected to face mask oxygen  Post-op Assessment: Report given to PACU RN and Post -op Vital signs reviewed and stable  Post vital signs: Reviewed and stable  Complications: No apparent anesthesia complications

## 2012-07-24 NOTE — Care Management Note (Signed)
    Page 1 of 1   07/24/2012     2:19:44 PM   CARE MANAGEMENT NOTE 07/24/2012  Patient:  Amanda Kirk, Amanda Kirk   Account Number:  [id number]  Date Initiated:  07/24/2012  Documentation initiated by:  Lorenda Ishihara  Subjective/Objective Assessment:   41 yo female admitted with abd pain, lap chole the following day. PTA lived at home with spouse.     Action/Plan:   d/c when stable   Anticipated DC Date:  07/25/2012   Anticipated DC Plan:  HOME/SELF CARE      DC Planning Services  CM consult      Choice offered to / List presented to:             Status of service:  Completed, signed off Medicare Important Message given?   (If response is "NO", the following Medicare IM given date fields will be blank) Date Medicare IM given:   Date Additional Medicare IM given:    Discharge Disposition:  HOME/SELF CARE  Per UR Regulation:  Reviewed for med. necessity/level of care/duration of stay  If discussed at Long Length of Stay Meetings, dates discussed:    Comments:

## 2012-07-24 NOTE — Progress Notes (Signed)
Patient seen and evaluated Amanda Kirk, Labs, and Dr. Doreen Salvage note were reviewed.  She still has RUQ pain focally and concern for cholecystitis.  I discussed with her the option for cholecystectomy and its risks.  The risks of infection, bleeding, pain, persistent symptoms, scarring, injury to bowel or bile ducts, retained stone, diarrhea, need for additional procedures, and need for open surgery discussed with the patient. She would like to proceed with cholecystectomy for possible treatment.

## 2012-07-24 NOTE — Anesthesia Postprocedure Evaluation (Signed)
  Anesthesia Post-op Note  Patient: Amanda Kirk  Procedure(s) Performed: Procedure(s) (LRB): LAPAROSCOPIC CHOLECYSTECTOMY WITH INTRAOPERATIVE CHOLANGIOGRAM (N/A)  Patient Location: PACU  Anesthesia Type: General  Level of Consciousness: awake and alert   Airway and Oxygen Therapy: Patient Spontanous Breathing  Post-op Pain: mild  Post-op Assessment: Post-op Vital signs reviewed, Patient's Cardiovascular Status Stable, Respiratory Function Stable, Patent Airway and No signs of Nausea or vomiting  Post-op Vital Signs: stable  Complications: No apparent anesthesia complications

## 2012-07-24 NOTE — Anesthesia Preprocedure Evaluation (Addendum)
Anesthesia Evaluation  Patient identified by MRN, date of birth, ID band Patient awake    Reviewed: Allergy & Precautions, H&P , NPO status , Patient's Chart, lab work & pertinent test results  Airway Mallampati: II TM Distance: >3 FB Neck ROM: full    Dental No notable dental hx.    Pulmonary neg pulmonary ROS,  breath sounds clear to auscultation  Pulmonary exam normal       Cardiovascular Exercise Tolerance: Good negative cardio ROS  Rhythm:regular Rate:Normal     Neuro/Psych negative neurological ROS  negative psych ROS   GI/Hepatic negative GI ROS, Neg liver ROS,   Endo/Other  negative endocrine ROS  Renal/GU negative Renal ROS  negative genitourinary   Musculoskeletal   Abdominal   Peds  Hematology negative hematology ROS (+)   Anesthesia Other Findings   Reproductive/Obstetrics negative OB ROS                           Anesthesia Physical Anesthesia Plan  ASA: II  Anesthesia Plan: General   Post-op Pain Management:    Induction: Intravenous  Airway Management Planned: Oral ETT  Additional Equipment:   Intra-op Plan:   Post-operative Plan:   Informed Consent: I have reviewed the patients History and Physical, chart, labs and discussed the procedure including the risks, benefits and alternatives for the proposed anesthesia with the patient or authorized representative who has indicated his/her understanding and acceptance.   Dental Advisory Given and Dental advisory given  Plan Discussed with: CRNA  Anesthesia Plan Comments:        Anesthesia Quick Evaluation

## 2012-07-24 NOTE — Brief Op Note (Signed)
07/23/2012 - 07/24/2012  10:07 AM  PATIENT:  Amanda Kirk  41 y.o. female  PRE-OPERATIVE DIAGNOSIS:  cholecystitis  POST-OPERATIVE DIAGNOSIS:  cholecystitis  PROCEDURE:  Procedure(s) (LRB) with comments: LAPAROSCOPIC CHOLECYSTECTOMY WITH INTRAOPERATIVE CHOLANGIOGRAM (N/A)  SURGEON:  Surgeon(s) and Role:    * Lodema Pilot, DO - Primary  PHYSICIAN ASSISTANT:   ASSISTANTS: none   ANESTHESIA:   general  EBL:  Total I/O In: 2300 [I.V.:2300] Out: -   BLOOD ADMINISTERED:none  DRAINS: none   LOCAL MEDICATIONS USED:  MARCAINE    and LIDOCAINE   SPECIMEN:  Source of Specimen:  gallbladder  DISPOSITION OF SPECIMEN:  PATHOLOGY  COUNTS:  YES do to an with leaking  TOURNIQUET:  * No tourniquets in log *  DICTATION: .Other Dictation: Dictation Number   PLAN OF CARE: Admit for overnight observation  PATIENT DISPOSITION:  PACU - hemodynamically stable.   Delay start of Pharmacological VTE agent (>24hrs) due to surgical blood loss or risk of bleeding: no

## 2012-07-25 ENCOUNTER — Encounter (HOSPITAL_COMMUNITY): Payer: Self-pay | Admitting: General Surgery

## 2012-07-25 MED ORDER — ACETAMINOPHEN 325 MG PO TABS: 650.0000 mg | ORAL_TABLET | ORAL | Status: DC | PRN

## 2012-07-25 MED ORDER — INFLUENZA VIRUS VACC SPLIT PF IM SUSP
0.5000 mL | Freq: Once | INTRAMUSCULAR | Status: AC
  Administered 2012-07-25: 0.5 mL via INTRAMUSCULAR
  Filled 2012-07-25: qty 0.5

## 2012-07-25 MED ORDER — INFLUENZA VIRUS VACC SPLIT PF IM SUSP: 0.5000 mL | Freq: Once | INTRAMUSCULAR | Status: DC

## 2012-07-25 NOTE — Progress Notes (Deleted)
1 Day Post-Op  Subjective: Up walking in room doing ADL's still "a little sore, no nausea" Tolerated diet. +BS,flatus.  Objective: Vital signs in last 24 hours: Temp:  [97.6 F (36.4 C)-99.7 F (37.6 C)] 98.8 F (37.1 C) (10/30 0517) Pulse Rate:  [60-106] 93  (10/30 0517) Resp:  [16-23] 16  (10/30 0517) BP: (121-158)/(64-86) 121/77 mmHg (10/30 0517) SpO2:  [95 %-100 %] 98 % (10/30 0517) Last BM Date: 07/23/12  Intake/Output from previous day: 10/29 0701 - 10/30 0700 In: 4355 [P.O.:480; I.V.:3875] Out: 2930 [Urine:2930] Intake/Output this shift:    General appearance: alert, cooperative, appears stated age and no distress Chest: CTA bilaterally Cardiac: RRR No M/R/G Abdomen:non distended,  appropriately tender, all surgical wound sites are well approximated, no erythema or drainage seen on exam. + BS and Flatus  Tolerating low fat diet. VSS, afebrile  Lab Results:   Basename 07/24/12 0434 07/23/12 1650  WBC 6.1 5.5  HGB 12.9 13.3  HCT 38.6 39.1  PLT 284 318   BMET  Basename 07/24/12 0434 07/23/12 1650  NA 135 134*  K 4.0 3.5  CL 100 98  CO2 26 25  GLUCOSE 96 167*  BUN 6 8  CREATININE 0.92 0.71  CALCIUM 9.0 9.8   PT/INR No results found for this basename: LABPROT:2,INR:2 in the last 72 hours ABG No results found for this basename: PHART:2,PCO2:2,PO2:2,HCO3:2 in the last 72 hours  Studies/Results: Dg Cholangiogram Operative  07/24/2012  *RADIOLOGY REPORT*  Clinical Data:   Cholecystectomy  INTRAOPERATIVE CHOLANGIOGRAM  Technique:  Cholangiographic images from the C-arm fluoroscopic device were submitted for interpretation post-operatively.  Please see the procedural report for the amount of contrast and the fluoroscopy time utilized.  Comparison:  None.  Findings:  There is good opacification of the common bile duct which is normal in caliber.  No filling defect or stricture.  Bile duct is patent to the duodenum.  No bile leak.  There is extravasation of contrast  at the cystic duct insertion site.  This is most likely leaking around the catheter.  IMPRESSION: Negative for biliary obstruction.  There is some extravasation of contrast at the cystic duct injection site which is felt to be leak around the catheter.   Original Report Authenticated By: Camelia Phenes, M.D.    US Abdomen Complete  07/23/2012  *RADIOLOGY REPORT*  Clinical Data:  Abdominal pain.  COMPLETE ABDOMINAL ULTRASOUND  Comparison:  CT of the abdomen pelvis without contrast 02/23/2007.  Findings:  Gallbladder:  Gallbladder wall is thickened measuring at least 4.4 mm.  There is minimal pericholecystic fluid as well.  A shadowing stone at the neck of the gallbladder measures at least 1.3 cm. There is no sonographic Murphy's sign.  Common bile duct:  Normal in caliber. No biliary ductal dilation. The maximal diameter is 3.9 mm.  Liver:  There is loss of normal internal echotexture with decreased increased echogenicity.  No focal lesions are evident.  IVC:  Appears normal.  Pancreas:  No focal abnormality seen.  Spleen:  Normal size and echotexture without focal parenchymal abnormality.  The maximal length is 7.9 cm.  Right Kidney:  No hydronephrosis.  Well-preserved cortex.  Normal size and parenchymal echotexture without focal abnormalities. The maximal length is 10.1 cm, within normal limits.  Left Kidney:  Multiple shadowing stones are present.  The largest is a 6 mm stone in the midportion of the left kidney.  There is three additional stones range from 4.0-5.1 mm also in the midportion.  There is no obstruction or hydronephrosis.  Abdominal aorta:  The distal aorta bifurcation is not visualized due to bowel gas.  IMPRESSION:  1.  Gallbladder wall thickening and large gallstone in the neck of the gallbladder.  Although there is no sonographic Murphy's sign, early cholecystitis is considered. 2.  Nonobstructive left nephrolithiasis. 3.  Hepatic steatosis.   Original Report Authenticated By: Jamesetta Orleans.  MATTERN, M.D.     Anti-infectives: Anti-infectives     Start     Dose/Rate Route Frequency Ordered Stop   07/23/12 2230   Ampicillin-Sulbactam (UNASYN) 3 g in sodium chloride 0.9 % 100 mL IVPB  Status:  Discontinued        3 g 100 mL/hr over 60 Minutes Intravenous Every 6 hours 07/23/12 2217 07/24/12 1117          Assessment/Plan: There is no problem list on file for this patient. s/p Procedure(s) (LRB) with comments: LAPAROSCOPIC CHOLECYSTECTOMY WITH INTRAOPERATIVE CHOLANGIOGRAM (N/A)  Plan:  1. Ambulate, continue with diet 2. DC IVF 3. Probable discharge later today   LOS: 2 days    Amanda Kirk 07/25/2012

## 2012-07-25 NOTE — Op Note (Signed)
NAMEVALJEAN, RUPPEL NO.:  [id number]  MEDICAL RECORD NO.:  [id number]  LOCATION:  1528                         FACILITY:  [facility]  PHYSICIAN:  Lodema Pilot, MD       DATE OF BIRTH:  11-01-1970  DATE OF PROCEDURE:  07/24/2012 DATE OF DISCHARGE:                              OPERATIVE REPORT   PROCEDURE:  Laparoscopic cholecystectomy with intraoperative cholangiogram.  PREOPERATIVE DIAGNOSIS:  Acute cholecystitis.  POSTOPERATIVE DIAGNOSIS:  Acute cholecystitis.  SURGEON:  Lodema Pilot, MD  ASSISTANT:  None.  ANESTHESIA:  General endotracheal tube anesthesia.  FLUIDS:  1600 mL of crystalloid.  ESTIMATED BLOOD LOSS:  Minimal.  DRAINS:  None.  SPECIMENS:  Gallbladder and contents sent to Pathology for permanent sectioning.  COMPLICATIONS:  None apparent.  FINDINGS:  Early acute cholecystitis, normal cholangiogram.  INDICATION FOR PROCEDURE:  Ms. Riendeau is a 41 year old female with several episodes of right upper quadrant pain with eating.  She came in with increasing pain throughout the week, gallbladder wall thickening, and cholelithiasis, and concern for acute cholecystitis.  OPERATIVE DETAILS:  Ms. Yaney was seen and evaluated in the preoperative area and risks and benefits of the procedure were discussed in lay terms.  Informed consent was obtained.  She was already on antibiotics and taken to the operating room, placed on the table in a supine position.  General endotracheal tube anesthesia was obtained, and her abdomen was prepped and draped in the standard surgical fashion. Procedure time-out was performed with all operative team members confirmed proper patient and procedure.  Procedure time-out was performed with all operative team members to confirm proper patient, procedure, and then a supraumbilical midline incision was made in the skin and dissection carried down to the subcutaneous tissue using blunt dissection.  The abdominal wall  fascia was sharply elevated and sharply incised, and the peritoneum entered bluntly.  A 12-mm balloon port was placed and pneumoperitoneum was obtained.  Laparoscope was introduced and there was no evidence of bowel injury upon entry, and two 5-mm right upper quadrant trocars were placed under direct visualization.  An 11-mm epigastric trocar was placed under direct visualization.  The gallbladder was retracted cephalad and the peritoneum was taken down using some sharp dissection and mostly with blunt dissection.  Cautery was not used during this part of the procedure.  This exposed the Hartmann's pouch of the gallbladder and I was able to retract the gallbladder laterally and taken the peritoneum down bluntly, exposed the cystic artery coursing up onto the gallbladder.  The artery was skeletonized and was clipped between hemoclips and the cystic duct was easily identified, and dissected high up near the gallbladder.  Triangle of Calot was skeletonized.  Critical view of safety was obtained visualizing a single cystic duct, entering the gallbladder with liver parenchyma visualized through the triangle of Calot and a clip was placed on the gallbladder side of the cystic duct.  A small cystic ductotomy was made and a cholangiogram catheter was passed through the abdominal wall through a separate stab incision.  Cholangiogram was clipped into place and cholangiogram was performed, which demonstrated normal flow of bile into the duodenum, normal cystic duct, common bile  duct and right and left hepatic ducts with no filling defects.  The catheter was removed and clips were placed on the cystic duct stump, and because the clips were about the same size as the ducts, I placed a 0 PDS Endoloop just under the clips as well for added security.  Then, gallbladder was removed from the gallbladder fossa using Bovie electrocautery and the gallbladder was completely removed.  The gallbladder was not  entered during the dissection.  The gallbladder was completely removed, placed an EndoCatch bag and removed from the umbilical trocar site.  She had a palpable gallstone to about a 1.5 cm in diameter, this was sent to Pathology for permanent sectioning.  The right upper quadrant was irrigated with sterile saline solution until irrigation returned clear, and the gallbladder fossa was inspected for hemostasis, which was noted to be adequate.  Again, the gallbladder fossa was noted to be hemostatic and the right upper quadrant trocars were removed under direct visualization.  The abdominal wall was noted to be hemostatic.  Prior to removing the final epigastric trocar, umbilical fascia was approximated with interrupted 0 Vicryl sutures and secured, and the abdomen was reinsufflated and the abdominal closure was noted to be adequate without any evidence of bowel injury.  Then, the final trocars were removed and the skin edges were injected with total of 40 mL of 1% lidocaine with epinephrine and 0.25% Marcaine in a 50:50 mixture.  Skin edges were approximated with 4-0 Monocryl subcuticular suture.  Skin was washed and dried.  Dermabond was applied.  All sponge, needle, and instrument counts were correct at the end of the case.  The patient tolerated the procedure well without apparent complications.          ______________________________ Lodema Pilot, MD     BL/MEDQ  D:  07/24/2012  T:  07/25/2012  Job:  [id number]

## 2012-07-25 NOTE — Discharge Summary (Signed)
Physician Discharge Summary  Patient ID: SEHAM GARDENHIRE MRN: [id number] DOB/AGE: [birth year] 41 y.o.  Admit date: 07/23/2012 Discharge date: 07/25/2012  Admission Diagnoses: acute cholecystitis  Discharge Diagnoses: s/p cholecystectomy Active Problems:  * No active hospital problems. *    Discharged Condition: stable  Hospital Course:  41 yo AAF who has had prior history of intermittent abdominal pain but in last week or so has increased in frequency and severity. Not sure if related to eating and always went away. Prior to admission to [facility] she developed ruq and epigastric pain that did not resolve as in the past. She had nausea but no emesis. She was having bowel movements and passing flatus. She had no fever. Given her clinical presentation and [place] results she was posted to the operating room for an emergent laparoscopic cholecystectomy by Dr. Biagio Quint.  Post operatively she has done very well, has remained hemodynamically stable and afebrile, has tolerated a diet w/o abdominal distention or N/V and is having bowel function. She is deemed stable for discharge to home self care and will f/u with Dr. Biagio Quint in 2 weeks time or as needed.  Consults: None  Significant Diagnostic Studies: labs   Treatments: IV hydration, antibiotics and analgesia.  Discharge Exam: Blood pressure 121/77, pulse 93, temperature 98.8 F (37.1 C), temperature source Oral, resp. rate 16, height 5\' 4"  (1.626 m), weight 166 lb 0.1 oz (75.3 kg), SpO2 98.00%. General appearance: alert, cooperative, appears stated age and no distress Chest: CTA bilaterally  Cardiac: RRR No M/R/G  Abdomen:non distended, appropriately tender, all surgical wound sites are well approximated, no erythema or drainage seen on exam. + BS and Flatus Tolerating low fat diet.  VSS, afebrile.   Disposition: Home self care with F/u in 2 weeks time with Dr. Biagio Quint     Medication List     As of 07/25/2012  8:20 AM    ASK your doctor about  these medications         loratadine 10 MG tablet   Commonly known as: CLARITIN   Take 10 mg by mouth daily.           Follow-up Information    Follow up with LAYTON, BRIAN DAVID, DO. Schedule an appointment as soon as possible for a visit in 2 weeks. (Call for an appointment with the [facility] clinic or Dr. Biagio Quint in 2-3 weeks.call sooner if you have an issue.)    Contact information:   [address] [place] [address] [phone]          Signed: Blenda Mounts [facility] Surgery Pager # [phone]  07/25/2012, 8:20 AM

## 2012-08-08 ENCOUNTER — Ambulatory Visit (INDEPENDENT_AMBULATORY_CARE_PROVIDER_SITE_OTHER): Payer: Non-veteran care | Admitting: General Surgery

## 2012-08-08 ENCOUNTER — Encounter (INDEPENDENT_AMBULATORY_CARE_PROVIDER_SITE_OTHER): Payer: Self-pay

## 2012-08-08 VITALS — BP 128/84 | HR 68 | Temp 97.2°F | Resp 16 | Ht 64.0 in | Wt 168.4 lb

## 2012-08-08 DIAGNOSIS — Z5189 Encounter for other specified aftercare: Secondary | ICD-10-CM

## 2012-08-08 DIAGNOSIS — Z4889 Encounter for other specified surgical aftercare: Secondary | ICD-10-CM

## 2012-08-08 NOTE — Progress Notes (Signed)
Subjective:     Patient ID: Amanda Kirk, female   DOB: [birth year], 41 y.o.   MRN: [id number]  HPI This patient follows up for about 2 weeks status post appendectomy cholecystectomy for acute cholecystitis. She has no complaints.  She says she feels well and has not had any more of the symptoms that she had preoperatively. She says that her bowels are improved from preop as well and is anxious to get back to work. Her pathology was benign.  Review of Systems     Objective:   Physical Exam Her abdomen is soft and nontender on exam her incisions are well-healed without sign of infection.    Assessment:     Status post laparoscopic cholecystectomy-doing well She is doing very well after her procedure. She has no complaints and is anxious to get back to work. Her pathology was benign.    Plan:     I think that she looks good and she came to a head and return to work with activity as tolerated. She can follow up with me on when necessary basis.

## 2012-11-14 ENCOUNTER — Encounter (HOSPITAL_COMMUNITY): Payer: Self-pay | Admitting: *Deleted

## 2012-11-14 ENCOUNTER — Inpatient Hospital Stay (HOSPITAL_COMMUNITY)
Admission: AD | Admit: 2012-11-14 | Discharge: 2012-11-14 | Disposition: A | Discharge status: Home / Self Care | Source: Ambulatory Visit | Attending: Obstetrics & Gynecology | Admitting: Obstetrics & Gynecology

## 2012-11-14 DIAGNOSIS — N390 Urinary tract infection, site not specified: Secondary | ICD-10-CM | POA: Insufficient documentation

## 2012-11-14 DIAGNOSIS — R300 Dysuria: Secondary | ICD-10-CM | POA: Insufficient documentation

## 2012-11-14 DIAGNOSIS — Z90710 Acquired absence of both cervix and uterus: Secondary | ICD-10-CM | POA: Insufficient documentation

## 2012-11-14 LAB — URINALYSIS, ROUTINE W REFLEX MICROSCOPIC
Glucose, UA: NEGATIVE mg/dL
Nitrite: POSITIVE — AB
Protein, ur: 30 mg/dL — AB
Urobilinogen, UA: 0.2 mg/dL (ref 0.0–1.0)

## 2012-11-14 LAB — URINE MICROSCOPIC-ADD ON

## 2012-11-14 MED ORDER — PHENAZOPYRIDINE HCL 200 MG PO TABS: 200.0000 mg | ORAL_TABLET | Freq: Three times a day (TID) | ORAL | Status: DC

## 2012-11-14 MED ORDER — SULFAMETHOXAZOLE-TMP DS 800-160 MG PO TABS: 1.0000 | ORAL_TABLET | Freq: Once | ORAL | Status: DC

## 2012-11-14 MED ORDER — PHENAZOPYRIDINE HCL 100 MG PO TABS
200.0000 mg | ORAL_TABLET | Freq: Once | ORAL | Status: AC
  Administered 2012-11-14: 200 mg via ORAL
  Filled 2012-11-14: qty 2

## 2012-11-14 MED ORDER — SULFAMETHOXAZOLE-TMP DS 800-160 MG PO TABS: 1.0000 | ORAL_TABLET | Freq: Two times a day (BID) | ORAL | Status: DC

## 2012-11-14 NOTE — MAU Note (Signed)
Last night started to have pain and burning with urination. States urine is bloody. No vaginal bleeding or discharge. Has had hysterectomy.

## 2012-11-14 NOTE — MAU Provider Note (Signed)
History     CSN: [id number]  Arrival date and time: 11/14/12 0746   First Provider Initiated Contact with Patient 11/14/12 [phone]      Chief Complaint  Patient presents with  . Dysuria   HPI Amanda Kirk 42 y.o.  Comes to MAU with dysuria all night and blood in her urine today.  Has never had this before.  Has had a history of kidney stones.  Denies any fever or back pain.  Client has had a hysterectomy.  OB History   Grav Para Term Preterm Abortions TAB SAB Ect Mult Living                  Past Medical History  Diagnosis Date  . Kidney stones   . Calculus of gallbladder with acute cholecystitis, without mention of obstruction   . Calculus of kidney   . Other chronic nonalcoholic liver disease     Past Surgical History  Procedure Laterality Date  . Abdominal hysterectomy    . Knee surgery      x 2  . Cholecystectomy  07/24/2012    Procedure: LAPAROSCOPIC CHOLECYSTECTOMY WITH INTRAOPERATIVE CHOLANGIOGRAM;  Surgeon: Lodema Pilot, DO;  Location: [facility] ORS;  Service: General;  Laterality: N/A;    No family history on file.  History  Substance Use Topics  . Smoking status: Never Smoker   . Smokeless tobacco: Not on file  . Alcohol Use: Yes     Comment: occassionally    Allergies:  Allergies  Allergen Reactions  . Codeine Nausea And Vomiting    Prescriptions prior to admission  Medication Sig Dispense Refill  . ibuprofen (ADVIL,MOTRIN) 200 MG tablet Take 400 mg by mouth daily as needed for pain.      Marland Kitchen oxymetazoline (AFRIN) 0.05 % nasal spray Place 3 sprays into the nose 3 (three) times daily.        Review of Systems  Constitutional: Negative for fever.  Gastrointestinal: Positive for abdominal pain. Negative for nausea, vomiting, diarrhea and constipation.  Genitourinary: Positive for dysuria, urgency, frequency and hematuria. Negative for flank pain.  Musculoskeletal: Negative for myalgias and back pain.   Physical Exam   Blood pressure 149/93, pulse  90, temperature 99.1 F (37.3 C), temperature source Oral, resp. rate 18, height 5\' 4"  (1.626 m), weight 73.936 kg (163 lb).  Physical Exam  Nursing note and vitals reviewed. Constitutional: She is oriented to person, place, and time. She appears well-developed and well-nourished.  HENT:  Head: Normocephalic.  Eyes: EOM are normal.  Neck: Neck supple.  GI: Soft. There is tenderness. There is no rebound and no guarding.  Genitourinary:  No CVA tenderness  Musculoskeletal: Normal range of motion.  Neurological: She is alert and oriented to person, place, and time.  Skin: Skin is warm and dry.  Psychiatric: She has a normal mood and affect.    MAU Course  Procedures Client's pain was improving with pyridium 200mg  PO given in MAU.  MDM Results for orders placed during the hospital encounter of 11/14/12 (from the past 24 hour(s))  URINALYSIS, ROUTINE W REFLEX MICROSCOPIC     Status: Abnormal   Collection Time    11/14/12  8:00 AM      Result Value Range   Color, Urine YELLOW  YELLOW   APPearance CLOUDY (*) CLEAR   Specific Gravity, Urine 1.015  1.005 - 1.030   pH 6.0  5.0 - 8.0   Glucose, UA NEGATIVE  NEGATIVE mg/dL   Hgb urine dipstick  LARGE (*) NEGATIVE   Bilirubin Urine NEGATIVE  NEGATIVE   Ketones, ur NEGATIVE  NEGATIVE mg/dL   Protein, ur 30 (*) NEGATIVE mg/dL   Urobilinogen, UA 0.2  0.0 - 1.0 mg/dL   Nitrite POSITIVE (*) NEGATIVE   Leukocytes, UA LARGE (*) NEGATIVE  URINE MICROSCOPIC-ADD ON     Status: Abnormal   Collection Time    11/14/12  8:00 AM      Result Value Range   Squamous Epithelial / LPF FEW (*) RARE   WBC, UA 21-50  <3 WBC/hpf   RBC / HPF 21-50  <3 RBC/hpf   Bacteria, UA FEW (*) RARE    Assessment and Plan  UTI  Plan Urine Culture pending Get your prescriptions filled and take all as directed. Drink at least 8 8-oz glasses of water every day. Seek additional medical care if you have vomiting, body aches, fever or back pain.  Would need to go to  [facility] rather than [facility]. Rx pyridium and Septra DS BID x 5 days   Maurene Hollin 11/14/2012, 9:30 AM

## 2012-11-16 LAB — URINE CULTURE

## 2013-03-24 ENCOUNTER — Ambulatory Visit (INDEPENDENT_AMBULATORY_CARE_PROVIDER_SITE_OTHER): Admitting: Family Medicine

## 2013-03-24 VITALS — BP 130/83 | HR 59 | Temp 98.0°F | Resp 18 | Ht 64.0 in | Wt 165.4 lb

## 2013-03-24 DIAGNOSIS — Z0000 Encounter for general adult medical examination without abnormal findings: Secondary | ICD-10-CM

## 2013-03-24 NOTE — Patient Instructions (Addendum)
Return necessary.  Recommend getting a screening mammogram. You can call one of the [facility] and schedule that for yourself.

## 2013-03-24 NOTE — Progress Notes (Signed)
  Subjective:    Patient ID: Amanda Kirk, female    DOB: 10-13-1970, 42 y.o.   MRN: [id number]  HPI    Review of Systems     Objective:   Physical Exam        Assessment & Plan:  PPD Placement note  Amanda Kirk, 43 y.o. female is here today for placement of PPD test  Reason for PPD test: PPD Placement note  Amanda Kirk, 42 y.o. female is here today for placement of PPD test  Reason for PPD test: work      PPD placed on 03/24/2013 at 9:24 AM. Patient advised to return for reading within 48-72 hours.   1. Have you ever had a TB Skin Test?:yes 2. Have you had a TB Skin Test in the past 6 weeks? no  3. Have you ever had a positive reaction? no  If yes, were you treated with INH? no   4. Have you ever had a BCG vaccine? no  5. Have you ever had Tuberculosis? no  6. Have you been in recent contact with anyone known or suspected of having active TB disease?: no      Date of exposure (if applicable): n/a       Name of person they were exposed to (if applicable): n/a  Patient's Country of origin?: n/a  7. Have you ever been advised by a healthcare provider not to have a TB skin test? no  8. Are yout taking any oral or IV steroid medication now or have you taken it in the last month? no  Verified in allergy area and with patient that the patient is not allergic to the products PPD is made of (Phenol or Tween). Yes   PPD placed on 03/24/2013 at 9:21 AM. Patient advised to return for reading within 48-72 hours.

## 2013-03-24 NOTE — Addendum Note (Signed)
Addended by: Lucia Gaskins on: 03/24/2013 09:29 AM   Modules accepted: Orders

## 2013-03-24 NOTE — Progress Notes (Signed)
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 She needs a physical exam for her employment.  Past medical history: Medical illnesses: No major illnesses Surgical: Has had a cholecystectomy, hysterectomy, and a couple of right knee surgeries. Regular medication: None Drug allergies: Codeine causes nausea:  Social history: Employment as above. She is married, has one child who is grown and gone. She attends college for a business degree, already has an associate degree. She does get some regular exercise.  Review of systems: Constitutional: Unremarkable HEENT: Unremarkable Cardiovascular: Unremarkable Respiratory: Unremarkable Gastrointestinal: Unremarkable Genitourinary: Unremarkable Musculoskeletal: Unremarkable Dermatologic: Unremarkable Endocrinologic: Unremarkable Neurologic: Unremarkable Psychiatric: Unremarkable   Physical examination Well-developed well-nourished young lady in no major distress. TMs are normal. Eyes PERRLA. Throat clear. Neck supple without nodes thyromegaly. Chest is clear to auscultation. Heart regular without murmurs gallops or arrhythmias. Abdomen soft without masses tenderness. Extremities unremarkable. Spine unremarkable. Skin unremarkable. Normal breast exam. She has not had a mammogram.  Assessment: Normal physical examination History cholecystectomy History of hysterectomy History of knee surgeries  Plan: Form will be completed. She is healthy to work.  Recommend mammography.

## 2013-03-26 ENCOUNTER — Ambulatory Visit (INDEPENDENT_AMBULATORY_CARE_PROVIDER_SITE_OTHER): Admitting: *Deleted

## 2013-03-26 DIAGNOSIS — Z111 Encounter for screening for respiratory tuberculosis: Secondary | ICD-10-CM

## 2013-03-26 LAB — TB SKIN TEST
Induration: 0 mm
TB Skin Test: NEGATIVE

## 2013-06-10 ENCOUNTER — Ambulatory Visit (INDEPENDENT_AMBULATORY_CARE_PROVIDER_SITE_OTHER): Admitting: Family Medicine

## 2013-06-10 VITALS — BP 126/80 | HR 69 | Temp 98.2°F | Resp 16 | Ht 63.5 in | Wt 164.2 lb

## 2013-06-10 DIAGNOSIS — M7520 Bicipital tendinitis, unspecified shoulder: Secondary | ICD-10-CM

## 2013-06-10 DIAGNOSIS — M7522 Bicipital tendinitis, left shoulder: Secondary | ICD-10-CM

## 2013-06-10 DIAGNOSIS — B354 Tinea corporis: Secondary | ICD-10-CM

## 2013-06-10 MED ORDER — PREDNISONE 20 MG PO TABS: ORAL_TABLET | ORAL | Status: DC

## 2013-06-10 MED ORDER — KETOCONAZOLE 2 % EX CREA: TOPICAL_CREAM | Freq: Two times a day (BID) | CUTANEOUS | Status: AC

## 2013-06-10 MED ORDER — PREDNISONE 20 MG PO TABS: ORAL_TABLET | ORAL | Status: AC

## 2013-06-10 NOTE — Patient Instructions (Addendum)
Rotator Cuff Injury The rotator cuff is the collective set of muscles and tendons that make up the stabilizing unit of your shoulder. This unit holds in the ball of the humerus (upper arm bone) in the socket of the scapula (shoulder blade). Injuries to this stabilizing unit most commonly come from sports or activities that cause the arm to be moved repeatedly over the head. Examples of this include throwing, weight lifting, swimming, racquet sports, or an injury such as falling on your arm. Chronic (longstanding) irritation of this unit can cause inflammation (soreness), bursitis, and eventual damage to the tendons to the point of rupture (tear). An acute (sudden) injury of the rotator cuff can result in a partial or complete tear. You may need surgery with complete tears. Small or partial rotator cuff tears may be treated conservatively with temporary immobilization, exercises and rest. Physical therapy may be needed. HOME CARE INSTRUCTIONS   Apply ice to the injury for 15-20 minutes 3-4 times per day for the first 2 days. Put the ice in a plastic bag and place a towel between the bag of ice and your skin.  If you have a shoulder immobilizer (sling and straps), do not remove it for as long as directed by your caregiver or until you see a caregiver for a follow-up examination. If you need to remove it, move your arm as little as possible.  You may want to sleep on several pillows or in a recliner at night to lessen swelling and pain.  Only take over-the-counter or prescription medicines for pain, discomfort, or fever as directed by your caregiver.  Do simple hand squeezing exercises with a soft rubber ball to decrease hand swelling. SEEK MEDICAL CARE IF:   Pain in your shoulder increases or new pain or numbness develops in your arm, hand, or fingers.  Your hand or fingers are colder than your other hand. SEEK IMMEDIATE MEDICAL CARE IF:   Your arm, hand, or fingers are numb or tingling.  Your  arm, hand, or fingers are increasingly swollen and painful, or turn white or blue. Document Released: 09/09/2000 Document Revised: 12/05/2011 Document Reviewed: 09/02/2008 [facility] Patient Information 2014 [place], [place]. Biceps Tendon Tendinitis (Distal) with Rehab Tendinitis involves inflammation and pain over the affected tendon. The distal biceps tendon (near the elbow) is vulnerable to tendinitis. Distal biceps tendonitis is usually due to the bony bump near the elbow (bicipital tuberosity) causing increased friction over the tendon. The biceps tendon attaches the biceps muscle to one bone in the elbow and two in the shoulder. It is important for proper function of the elbow and for turning the palm upward (supination). SYMPTOMS   Pain, aching, tenderness, and sometimes warmth or redness over the front of the elbow.  Pain when bending the elbow or turning the palm up, using the wrist, especially if performed against resistance.  Crackling sound (crepitation) when the tendon or elbow is moved or touched. CAUSES  The symptoms of biceps tendonitis are due to inflammation of the tendon. Inflammation may be caused by:  Strain from sudden increase in amount or intensity of activity.  Direct blow or injury to the elbow (uncommon).  Overuse or repetitive elbow bending or wrist rotation, particularly when turning the palm up, or with elbow hyperextension. RISK INCREASES WITH:  Sports that involve contact or overhead arm activity (throwing sports, gymnastics, weightlifting, bodybuilding, rock climbing).  Heavy labor.  Poor strength and flexibility.  Failure to warm up properly before activity.  Injury to other structures  of the elbow.  Restraint of the elbow. PREVENTION  Warm up and stretch properly before activity.  Allow time for recovery between activities.  Maintain physical fitness:  Strength, flexibility, and endurance.  Cardiovascular fitness.  Learn and use proper  exercise technique. PROGNOSIS  With proper treatment, biceps tendon tendonitis is usually curable within 6 weeks.  RELATED COMPLICATIONS   Longer healing time if not properly treated or if not given enough time to heal.  Chronically inflamed tendon that causes persistent pain with activity, that may progress to constant pain and potentially rupture of the tendon.  Recurring symptoms, especially if activity is resumed too soon, with overuse or with poor technique. TREATMENT  Treatment first involves ice and medicine to reduce pain and inflammation. Modify activities that cause pain, to reduce the chances of causing the condition to get worse. Strengthening and stretching exercises should be performed to promote proper use of the muscles of the elbow. These exercise may be performed at home or with a therapist. Other treatments may be given such as ultrasound or heat therapy. Surgery is usually not recommended.  MEDICATION  If pain medicine is needed, nonsteroidal anti-inflammatory medicines (aspirin and ibuprofen), or other minor pain relievers (acetaminophen), are often advised.  Do not take pain relieving medication for 7 days before surgery.  Prescription pain relievers may be given if your caregiver thinks they are needed. Use only as directed and only as much as you need. HEAT AND COLD:   Cold treatment (icing) should be applied for 10 to 15 minutes every 2 to 3 hours for inflammation and pain, and immediately after activity that aggravates your symptoms. Use ice packs or an ice massage.  Heat treatment may be used before performing stretching and strengthening activities prescribed by your caregiver, physical therapist, or athletic trainer. Use a heat pack or a warm water soak. SEEK MEDICAL CARE IF:   Symptoms get worse or do not improve in 2 weeks, despite treatment.  New, unexplained symptoms develop. (Drugs used in treatment may produce side effects.) EXERCISES  RANGE OF MOTION  (ROM) AND STRETCHING EXERCISES - Biceps Tendon Tendinitis (Distal) These exercises may help you when beginning to rehabilitate your injury. Your symptoms may go away with or without further involvement from your physician, physical therapist, or athletic trainer. While completing these exercises, remember:   Restoring tissue flexibility helps normal motion to return to the joints. This allows healthier, less painful movement and activity.  An effective stretch should be held for at least 30 seconds.  A stretch should never be painful. You should only feel a gentle lengthening or release in the stretched tissue. STRETCH  Elbow Flexors   Lie on a firm bed or countertop on your back. Be sure that you are in a comfortable position which will allow you to relax your arm muscles.  Place a folded towel under your right / left upper arm, so that your elbow and shoulder are at the same height. Extend your arm; your elbow should not rest on the bed or towel.  Allow the weight of your hand to straighten your elbow. Keep your arm and chest muscles relaxed. Your caretaker may ask you to increase the intensity of your stretch by adding a small wrist or hand weight.  Hold for __________ seconds. You should feel a stretch on the inside of your elbow. Slowly return to the starting position. Repeat __________ times. Complete this exercise __________ times per day. RANGE OF MOTION  Supination, Active  Stand or sit with your elbows at your side. Bend your right / left elbow to 90 degrees.  Turn your palm upward until you feel a gentle stretch on the inside of your forearm.  Hold this position for __________ seconds. Slowly release and return to the starting position. Repeat __________ times. Complete this stretch __________ times per day.  RANGE OF MOTION  Pronation, Active   Stand or sit with your elbows at your side. Bend your right / left elbow to 90 degrees.  Turn your palm downward until you feel a  gentle stretch on the top of your forearm.  Hold this position for __________ seconds. Slowly release and return to the starting position. Repeat __________ times. Complete this stretch __________ times per day.  STRENGTHENING EXERCISES - Biceps Tendon Tendinitis (Distal) These exercises may help you when beginning to rehabilitate your injury. They may resolve your symptoms with or without further involvement from your physician, physical therapist or athletic trainer. While completing these exercises, remember:   Muscles can gain both the endurance and the strength needed for everyday activities through controlled exercises.  Complete these exercises as instructed by your physician, physical therapist or athletic trainer. Increase the resistance and repetitions only as guided.  You may experience muscle soreness or fatigue, but the pain or discomfort you are trying to eliminate should never get worse during these exercises. If this pain does get worse, stop and make sure you are following the directions exactly. If the pain is still present after adjustments, discontinue the exercise until you can discuss the trouble with your clinician. STRENGTH - Elbow Flexors, Isometric   Stand or sit upright on a firm surface. Place your right / left arm so that your hand is palm-up and at the height of your waist.  Place your opposite hand on top of your forearm. Gently push down as your right / left arm resists. Push as hard as you can with both arms without causing any pain or movement at your right / left elbow. Hold this stationary position for __________ seconds.  Gradually release the tension in both arms. Allow your muscles to relax completely before repeating. Repeat __________ times. Complete this exercise __________ times per day. STRENGTH  Forearm Supinators   Sit with your right / left forearm supported on a table, keeping your elbow below shoulder height. Rest your hand over the edge, palm  down.  Gently grip a hammer or a soup ladle.  Without moving your elbow, slowly turn your palm and hand upward to a "thumbs-up" position.  Hold this position for __________ seconds. Slowly return to the starting position. Repeat __________ times. Complete this exercise __________ times per day.  STRENGTH  Forearm Pronators   Sit with your right / left forearm supported on a table, keeping your elbow below shoulder height. Rest your hand over the edge, palm up.  Gently grip a hammer or a soup ladle.  Without moving your elbow, slowly turn your palm and hand upward to a "thumbs-up" position.  Hold this position for __________ seconds. Slowly return to the starting position. Repeat __________ times. Complete this exercise __________ times per day.  STRENGTH  Elbow Flexors, Supinated  With good posture, stand or sit on a firm chair without armrests. Allow your right / left arm to rest at your side with your palm facing forward.  Holding a __________ weight, or gripping a rubber exercise band or tubing, bring your hand toward your shoulder.  Allow your muscles to  control the resistance as your hand returns to your side. Repeat __________ times. Complete this exercise __________ times per day.  STRENGTH  Elbow Flexors, Neutral  With good posture, stand or sit on a firm chair without armrests. Allow your right / left arm to rest at your side with your thumb facing forward.  Holding a __________ weight, or gripping a rubber exercise band or tubing, bring your hand toward your shoulder.  Allow your muscles to control the resistance as your hand returns to your side. Repeat __________ times. Complete this exercise __________ times per day.  Document Released: 09/12/2005 Document Revised: 12/05/2011 Document Reviewed: 12/25/2008 [facility] Patient Information 2014 [place], [place].

## 2013-06-10 NOTE — Progress Notes (Signed)
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  6 months of progressive left shoulder pain and left sided flank rash.  Objective:  NAD Tender left biceps and anterior joint line pain with slow, albeit painful ROM  Linear rash left flank with some depigmentation and surrounding elevation  Tinea corporis - Plan: ketoconazole (NIZORAL) 2 % cream  Biceps tendonitis, left - Plan: predniSONE (DELTASONE) 20 MG tablet, DISCONTINUED: predniSONE (DELTASONE) 20 MG tablet  Signed, Elvina Sidle, MD

## 2013-06-15 ENCOUNTER — Ambulatory Visit (INDEPENDENT_AMBULATORY_CARE_PROVIDER_SITE_OTHER): Admitting: Family Medicine

## 2013-06-15 VITALS — BP 142/90 | HR 72 | Temp 98.9°F | Resp 18 | Ht 63.5 in | Wt 165.0 lb

## 2013-06-15 DIAGNOSIS — M79609 Pain in unspecified limb: Secondary | ICD-10-CM

## 2013-06-15 DIAGNOSIS — M25819 Other specified joint disorders, unspecified shoulder: Secondary | ICD-10-CM

## 2013-06-15 DIAGNOSIS — M7522 Bicipital tendinitis, left shoulder: Secondary | ICD-10-CM

## 2013-06-15 MED ORDER — METHYLPREDNISOLONE ACETATE 80 MG/ML IJ SUSP
80.0000 mg | Freq: Once | INTRAMUSCULAR | Status: AC
  Administered 2013-06-15: 80 mg via INTRA_ARTICULAR

## 2013-06-15 NOTE — Progress Notes (Signed)
42 year old woman who has had persistent and worsening left shoulder pain. This previous note. She's not responded to the prednisone and she's back for injection.  Objective: Patient has point tenderness in the anterior aspect of the left shoulder joint. She has pain with flexion of the biceps.  After sterile prep, the area was sprayed with ethyl chloride. She was then injected with Depo-Medrol 80 mg and Marcaine 0.5% 1 cc.  Assessment: Biceps tendinitis  Plan: Wait and see if this injection doesn't calm down the pain and resolved the whole issue. I asked her to call if it's not better in 48 hours  Signed, Sheila Oats.D.

## 2014-11-14 ENCOUNTER — Emergency Department (HOSPITAL_COMMUNITY)

## 2014-11-14 ENCOUNTER — Emergency Department (HOSPITAL_COMMUNITY)
Admission: EM | Admit: 2014-11-14 | Discharge: 2014-11-14 | Disposition: A | Discharge status: Home / Self Care | Attending: Emergency Medicine | Admitting: Emergency Medicine

## 2014-11-14 ENCOUNTER — Encounter (HOSPITAL_COMMUNITY): Payer: Self-pay | Admitting: *Deleted

## 2014-11-14 DIAGNOSIS — Z79899 Other long term (current) drug therapy: Secondary | ICD-10-CM | POA: Diagnosis not present

## 2014-11-14 DIAGNOSIS — R0602 Shortness of breath: Secondary | ICD-10-CM | POA: Diagnosis not present

## 2014-11-14 DIAGNOSIS — Z8719 Personal history of other diseases of the digestive system: Secondary | ICD-10-CM | POA: Diagnosis not present

## 2014-11-14 DIAGNOSIS — Z7952 Long term (current) use of systemic steroids: Secondary | ICD-10-CM | POA: Diagnosis not present

## 2014-11-14 DIAGNOSIS — N201 Calculus of ureter: Secondary | ICD-10-CM | POA: Diagnosis not present

## 2014-11-14 DIAGNOSIS — Z791 Long term (current) use of non-steroidal anti-inflammatories (NSAID): Secondary | ICD-10-CM | POA: Insufficient documentation

## 2014-11-14 DIAGNOSIS — R109 Unspecified abdominal pain: Secondary | ICD-10-CM | POA: Diagnosis present

## 2014-11-14 LAB — I-STAT CHEM 8, ED
BUN: 8 mg/dL (ref 6–23)
CHLORIDE: 102 mmol/L (ref 96–112)
Calcium, Ion: 1.14 mmol/L (ref 1.12–1.23)
Creatinine, Ser: 0.8 mg/dL (ref 0.50–1.10)
GLUCOSE: 165 mg/dL — AB (ref 70–99)
HEMATOCRIT: 42 % (ref 36.0–46.0)
Hemoglobin: 14.3 g/dL (ref 12.0–15.0)
POTASSIUM: 3.4 mmol/L — AB (ref 3.5–5.1)
SODIUM: 137 mmol/L (ref 135–145)
TCO2: 20 mmol/L (ref 0–100)

## 2014-11-14 LAB — CBC WITH DIFFERENTIAL/PLATELET
BASOS ABS: 0 10*3/uL (ref 0.0–0.1)
Basophils Relative: 0 % (ref 0–1)
Eosinophils Absolute: 0.1 10*3/uL (ref 0.0–0.7)
Eosinophils Relative: 1 % (ref 0–5)
HEMATOCRIT: 38.4 % (ref 36.0–46.0)
HEMOGLOBIN: 12.7 g/dL (ref 12.0–15.0)
Lymphocytes Relative: 27 % (ref 12–46)
Lymphs Abs: 1.7 10*3/uL (ref 0.7–4.0)
MCH: 28 pg (ref 26.0–34.0)
MCHC: 33.1 g/dL (ref 30.0–36.0)
MCV: 84.8 fL (ref 78.0–100.0)
MONO ABS: 0.4 10*3/uL (ref 0.1–1.0)
MONOS PCT: 7 % (ref 3–12)
Neutro Abs: 4.2 10*3/uL (ref 1.7–7.7)
Neutrophils Relative %: 65 % (ref 43–77)
Platelets: 279 10*3/uL (ref 150–400)
RBC: 4.53 MIL/uL (ref 3.87–5.11)
RDW: 12.8 % (ref 11.5–15.5)
WBC: 6.4 10*3/uL (ref 4.0–10.5)

## 2014-11-14 LAB — URINALYSIS, ROUTINE W REFLEX MICROSCOPIC
BILIRUBIN URINE: NEGATIVE
Glucose, UA: NEGATIVE mg/dL
Ketones, ur: NEGATIVE mg/dL
Leukocytes, UA: NEGATIVE
NITRITE: NEGATIVE
PH: 7 (ref 5.0–8.0)
Protein, ur: NEGATIVE mg/dL
SPECIFIC GRAVITY, URINE: 1.013 (ref 1.005–1.030)
Urobilinogen, UA: 0.2 mg/dL (ref 0.0–1.0)

## 2014-11-14 LAB — URINE MICROSCOPIC-ADD ON

## 2014-11-14 MED ORDER — HYDROMORPHONE HCL 1 MG/ML IJ SOLN
1.0000 mg | Freq: Once | INTRAMUSCULAR | Status: AC
  Administered 2014-11-14: 1 mg via INTRAVENOUS
  Filled 2014-11-14: qty 1

## 2014-11-14 MED ORDER — TAMSULOSIN HCL 0.4 MG PO CAPS: 0.4000 mg | ORAL_CAPSULE | Freq: Every day | ORAL | Status: AC

## 2014-11-14 MED ORDER — ONDANSETRON HCL 4 MG PO TABS: 4.0000 mg | ORAL_TABLET | Freq: Four times a day (QID) | ORAL | Status: AC

## 2014-11-14 MED ORDER — OXYCODONE-ACETAMINOPHEN 5-325 MG PO TABS: 1.0000 | ORAL_TABLET | Freq: Four times a day (QID) | ORAL | Status: DC | PRN

## 2014-11-14 MED ORDER — ALBUTEROL SULFATE (2.5 MG/3ML) 0.083% IN NEBU: 5.0000 mg | INHALATION_SOLUTION | Freq: Once | RESPIRATORY_TRACT | Status: DC

## 2014-11-14 MED ORDER — IPRATROPIUM BROMIDE 0.02 % IN SOLN: 0.5000 mg | Freq: Once | RESPIRATORY_TRACT | Status: DC

## 2014-11-14 MED ORDER — ONDANSETRON HCL 4 MG/2ML IJ SOLN
4.0000 mg | Freq: Once | INTRAMUSCULAR | Status: AC
  Administered 2014-11-14: 4 mg via INTRAVENOUS
  Filled 2014-11-14: qty 2

## 2014-11-14 NOTE — ED Provider Notes (Signed)
6:00 AM Patient signed out to me by Sharen HonesGail Schultz, NP at shift change.  Patient with a history of kidney stones presents today with flank pain.  CT renal stone study pending.     7:01 AM Reassessed patient. She is comfortable at this time.  Pain and nausea well controlled.  Discussed results of the CT scan with the patient.  Patient discharged home with Percocet, Zofran, and Flomax.  Patient given referral to Urology.  Return precautions given.    IMPRESSION: 1. Obstructing 2 x 3 mm calculus in the distal right ureter with hydroureter and hydronephrosis 2. Bilateral nephrolithiasis 3. 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 Fat containing umbilical hernia  Santiago GladHeather Kerstin Crusoe, PA-C 11/16/14 2139  April K Palumbo-Rasch, MD 11/17/14 2304

## 2014-11-14 NOTE — ED Provider Notes (Addendum)
CSN: [id number]     Arrival date & time 11/14/14  0422 History   First MD Initiated Contact with Patient 11/14/14 0455     Chief Complaint  Patient presents with  . Flank Pain  . Abdominal Pain     (Consider location/radiation/quality/duration/timing/severity/associated sxs/prior Treatment) HPI Comments: Patient with HX kidney stones now with R flank pain radiating to abdomen Has been able to pass all but 1 stone   Patient is a 44 y.o. female presenting with flank pain and abdominal pain. The history is provided by the patient.  Flank Pain This is a recurrent problem. The current episode started today. The problem occurs constantly. The problem has been unchanged. Associated symptoms include abdominal pain, nausea and urinary symptoms. Pertinent negatives include no fever or vomiting. Nothing aggravates the symptoms. She has tried nothing for the symptoms. The treatment provided no relief.  Abdominal Pain Associated symptoms: hematuria and nausea   Associated symptoms: no dysuria, no fever and no vomiting     Past Medical History  Diagnosis Date  . Kidney stones   . Calculus of gallbladder with acute cholecystitis, without mention of obstruction   . Calculus of kidney   . Other chronic nonalcoholic liver disease    Past Surgical History  Procedure Laterality Date  . Abdominal hysterectomy    . Knee surgery      x 2  . Cholecystectomy  07/24/2012    Procedure: LAPAROSCOPIC CHOLECYSTECTOMY WITH INTRAOPERATIVE CHOLANGIOGRAM;  Surgeon: Lodema PilotBrian Layton, DO;  Location: [facility];  Service: General;  Laterality: N/A;   Family History  Problem Relation Age of Onset  . Hypertension Mother   . Hypertension Father   . Lupus Sister   . Diabetes Brother   . Stroke Maternal Grandmother   . Hypertension Brother    History  Substance Use Topics  . Smoking status: Never Smoker   . Smokeless tobacco: Never Used  . Alcohol Use: Yes     Comment: occassionally   OB History    No data  available     Review of Systems  Constitutional: Negative for fever.  Gastrointestinal: Positive for nausea and abdominal pain. Negative for vomiting.  Genitourinary: Positive for hematuria and flank pain. Negative for dysuria.  All other systems reviewed and are negative.     Allergies  Codeine  Home Medications   Prior to Admission medications   Medication Sig Start Date End Date Taking? Authorizing Provider  aspirin-sod bicarb-citric acid (ALKA-SELTZER) 325 MG TBEF tablet Take 325 mg by mouth every 6 (six) hours as needed (sinus).   Yes Historical Provider, MD  HYDROcodone-acetaminophen (NORCO/VICODIN) 5-325 MG per tablet Take 1 tablet by mouth every 6 (six) hours as needed for pain.   Yes Historical Provider, MD  ibuprofen (ADVIL,MOTRIN) 200 MG tablet Take 400 mg by mouth daily as needed for pain.   Yes Historical Provider, MD  naproxen sodium (ANAPROX) 220 MG tablet Take 220 mg by mouth 2 (two) times daily with a meal.   Yes Historical Provider, MD  oxymetazoline (AFRIN) 0.05 % nasal spray Place 3 sprays into the nose 3 (three) times daily.   Yes Historical Provider, MD  ketoconazole (NIZORAL) 2 % cream Apply topically 2 (two) times daily. Patient not taking: Reported on 11/14/2014 06/10/13   Elvina SidleKurt Lauenstein, MD  ondansetron (ZOFRAN) 4 MG tablet Take 1 tablet (4 mg total) by mouth every 6 (six) hours. 11/14/14   Santiago GladHeather Laisure, PA-C  oxyCODONE-acetaminophen (PERCOCET/ROXICET) 5-325 MG per tablet Take 1-2 tablets by  mouth every 6 (six) hours as needed for severe pain. 11/14/14   Santiago Glad, PA-C  predniSONE (DELTASONE) 20 MG tablet 3-2-2-1-1-1 daily with food for shoulder tendonitis and rotator cuff strain Patient not taking: Reported on 11/14/2014 06/10/13   Elvina Sidle, MD  tamsulosin (FLOMAX) 0.4 MG CAPS capsule Take 1 capsule (0.4 mg total) by mouth daily. 11/14/14   Heather Laisure, PA-C   BP 142/80 mmHg  Pulse 65  Temp(Src) 98.1 F (36.7 C) (Oral)  Resp 16  SpO2  100% Physical Exam  Constitutional: She is oriented to person, place, and time. She appears well-developed and well-nourished.  Eyes: Pupils are equal, round, and reactive to light.  Neck: Normal range of motion.  Cardiovascular: Normal rate.   Abdominal: Soft. She exhibits no distension. There is no tenderness.  Musculoskeletal: Normal range of motion.  Neurological: She is alert and oriented to person, place, and time.  Skin: Skin is warm.  Nursing note and vitals reviewed.   ED Course  Procedures (including critical care time) Labs Review Labs Reviewed  URINALYSIS, ROUTINE W REFLEX MICROSCOPIC - Abnormal; Notable for the following:    Hgb urine dipstick LARGE (*)    All other components within normal limits  I-STAT CHEM 8, ED - Abnormal; Notable for the following:    Potassium 3.4 (*)    Glucose, Bld 165 (*)    All other components within normal limits  CBC WITH DIFFERENTIAL/PLATELET  URINE MICROSCOPIC-ADD ON    Imaging Review No results found.   EKG Interpretation None      MDM   Final diagnoses:  Flank pain  Right ureteral stone  SOB (shortness of breath)  Abdominal pain         Arman Filter, NP 11/25/14 2005  April K Palumbo-Rasch, MD 11/27/14 2300  Earley Favor, NP 01/11/15 2100  Cy Blamer, MD 01/13/15 [phone]

## 2014-11-14 NOTE — ED Notes (Signed)
Pt ambulated to BR at this time.

## 2014-11-14 NOTE — ED Notes (Signed)
Pt states that she began having rt flank pain and lower abd pain approx 3 hrs ago; pt states that she has a hx of kidney stones and that this feels similar; pt c/o nausea no vomiting; pt also c/o urinary frequency and urgency

## 2015-11-05 IMAGING — CT CT RENAL STONE PROTOCOL
1 series · 14 of 17 positions shown, 19 images · non-contrast
Comparison: 02/23/2007

CLINICAL DATA: Right flank pain, nausea, urinary urgency.

EXAM:
CT ABDOMEN AND PELVIS WITHOUT CONTRAST
TECHNIQUE: Multidetector CT imaging of the abdomen and pelvis was performed
following the standard protocol without IV contrast.

[Series 3: lung · axial · 0.65mm/px · z∈[+1445,+1515]mm · 14 of 17 slices shown, 19 images]
[im 2/17  soft-tissue]
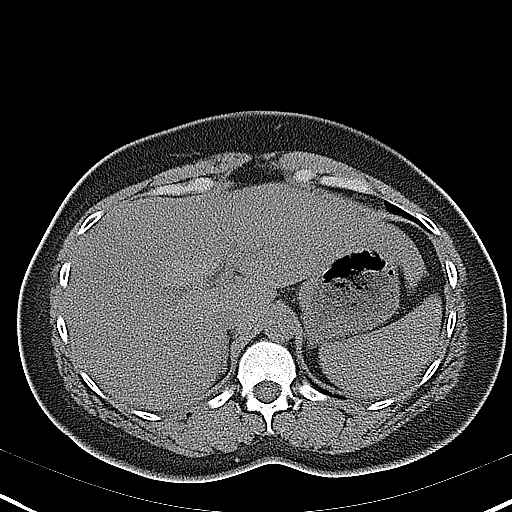
[im 2/17  bone]
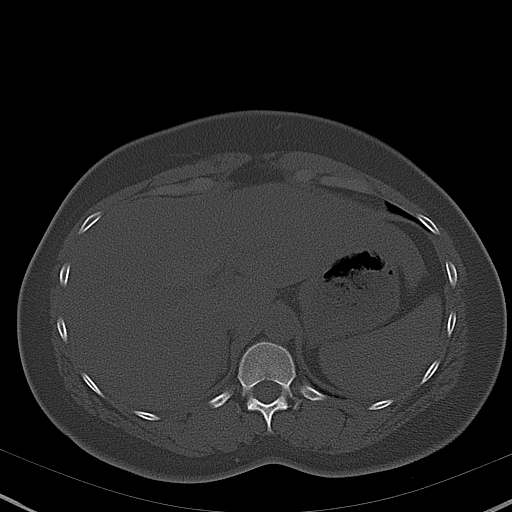
[im 3/17  soft-tissue]
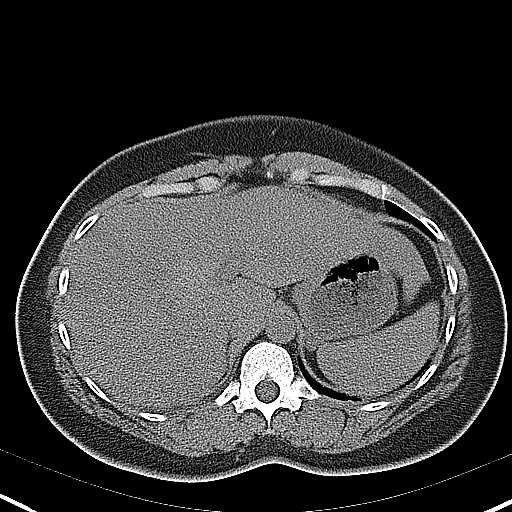
[im 4/17  soft-tissue]
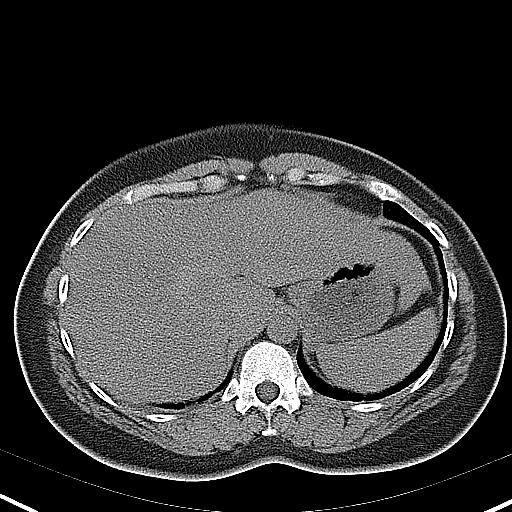
[im 6/17  soft-tissue]
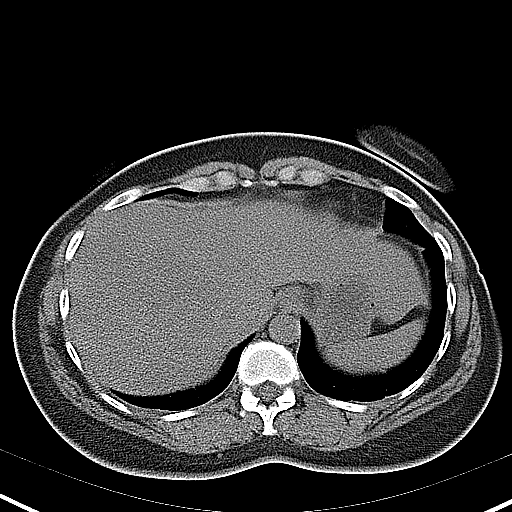
[im 7/17  soft-tissue]
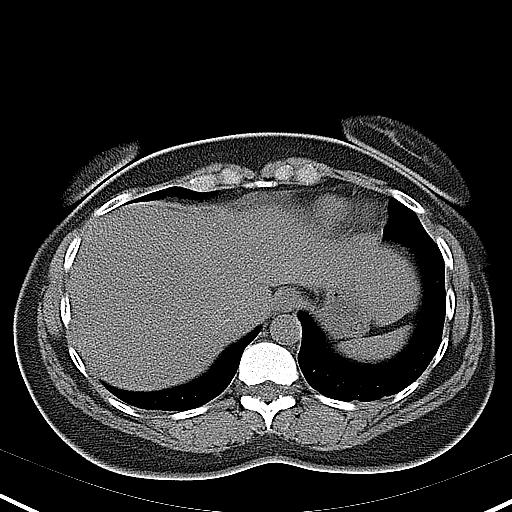
[im 8/17  soft-tissue]
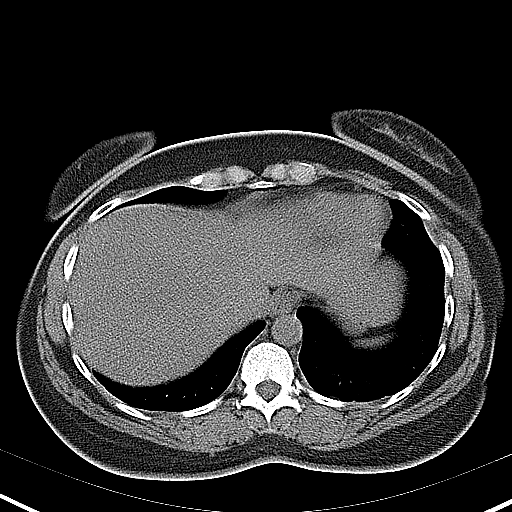
[im 9/17  soft-tissue]
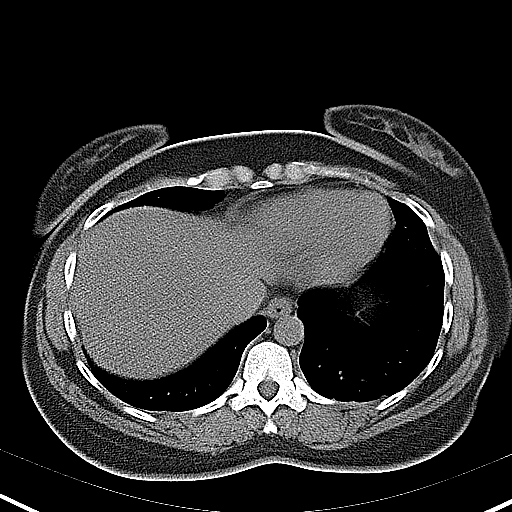
[im 10/17  soft-tissue]
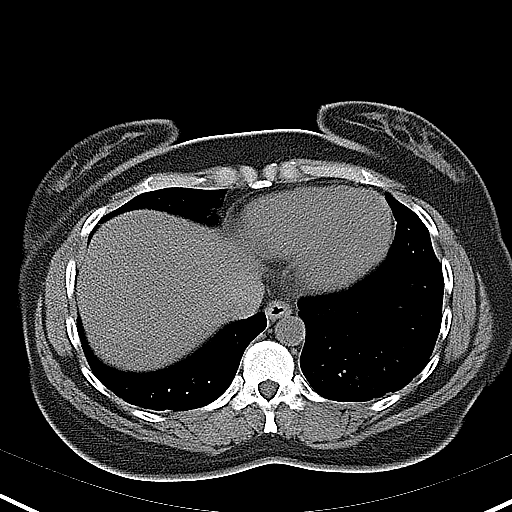
[im 11/17  soft-tissue]
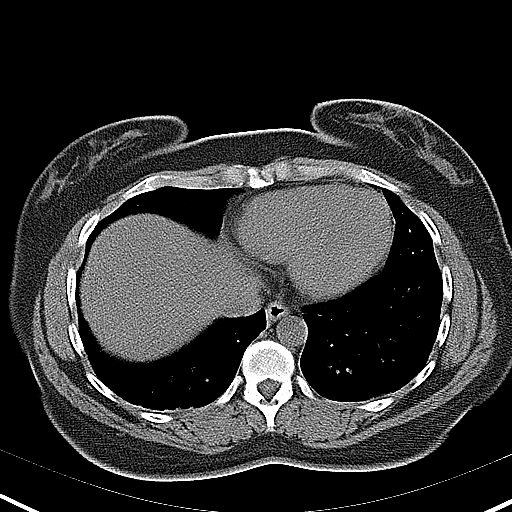
[im 11/17  bone]
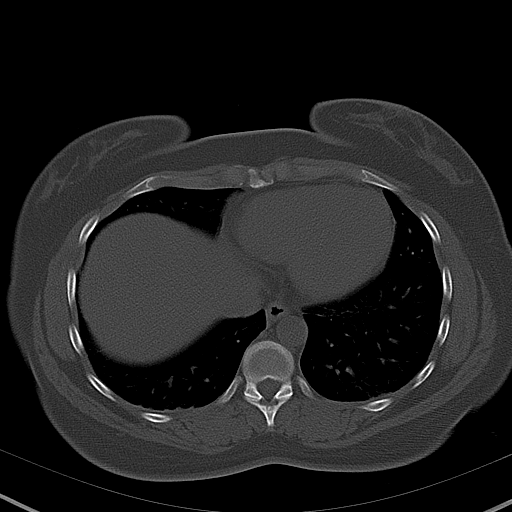
[im 12/17  soft-tissue]
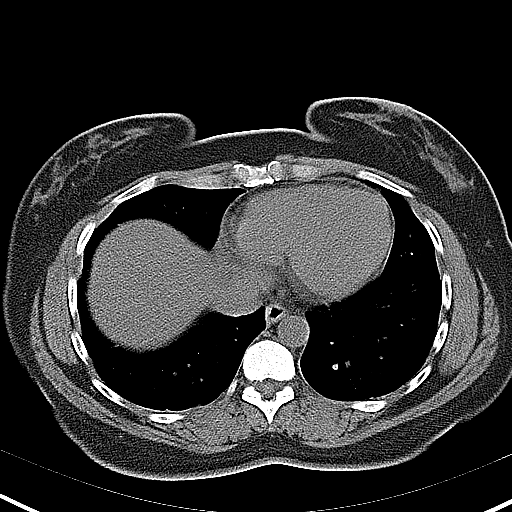
[im 13/17  lung]
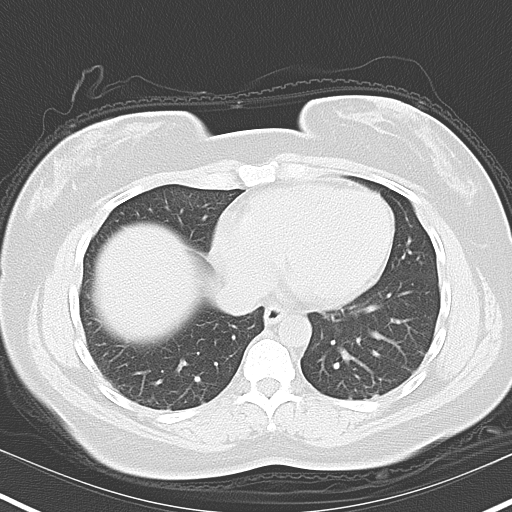
[im 14/17  soft-tissue]
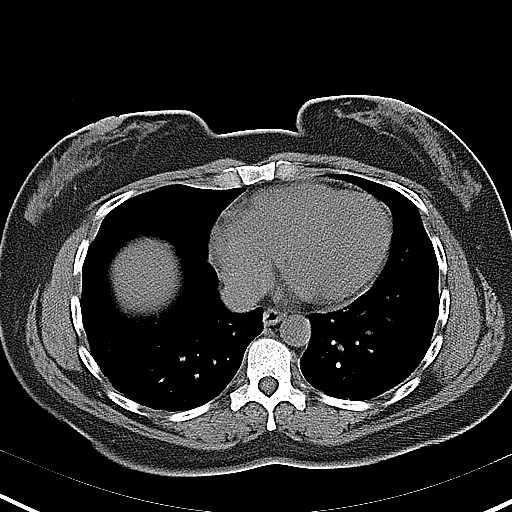
[im 14/17  lung]
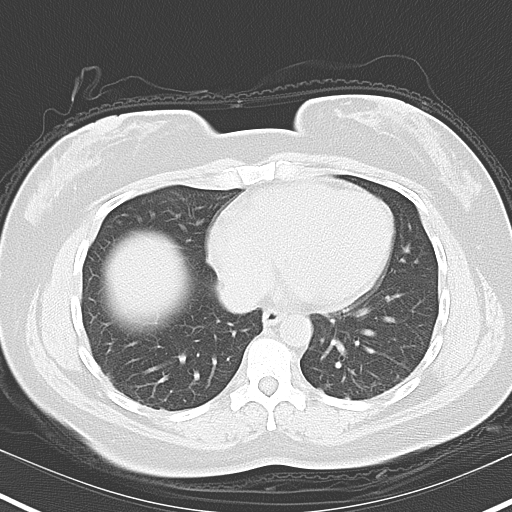
[im 15/17  soft-tissue]
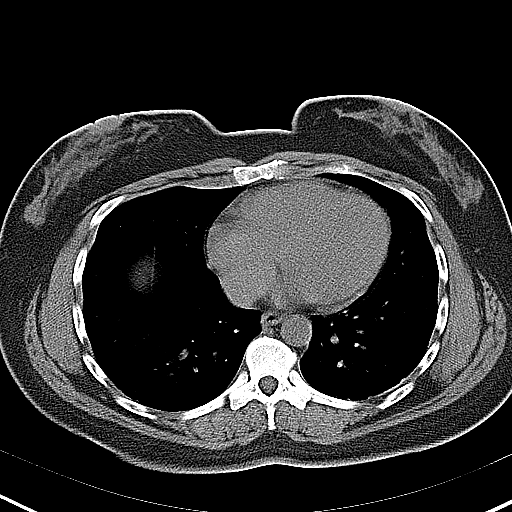
[im 15/17  lung]
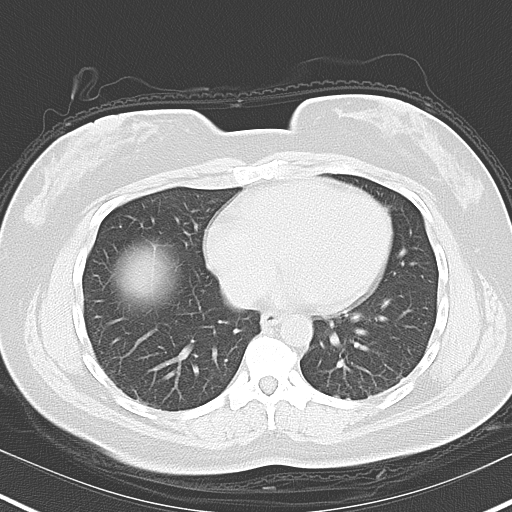
[im 16/17  soft-tissue]
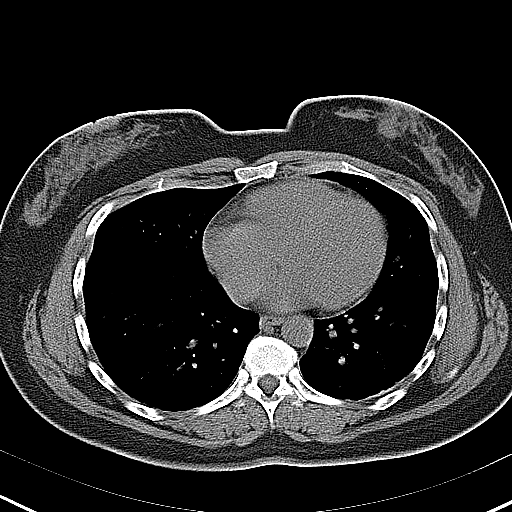
[im 16/17  lung]
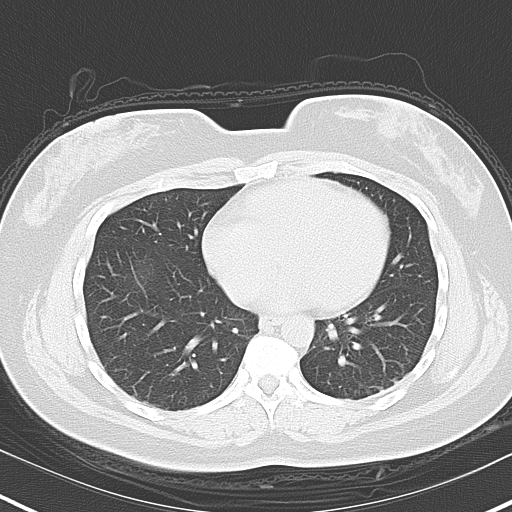

[14 of 17 positions shown; findings below may reference images not displayed]

FINDINGS: There is an obstructing 2 x 3 mm calculus in the right ureter, 2 cm
proximal to the ureterovesical junction. There is associated
hydroureter and hydronephrosis. Multiple additional collecting
system calculi are present bilaterally, measuring up to 5 mm in the
right collecting system and 7 mm in the left collecting system.

There is a 5.5 cm right ovarian dermoid tumor.

There are unremarkable appearances of the liver, spleen, pancreas
and adrenals. There is prior cholecystectomy. There is prior
hysterectomy. Mesentery and bowel appear unremarkable except for
uncomplicated colonic diverticulosis. No significant abnormalities
are evident in the lower chest. There is a small fat containing
umbilical hernia.
IMPRESSION: 1. Obstructing 2 x 3 mm calculus in the distal right ureter with
hydroureter and hydronephrosis
2. Bilateral nephrolithiasis
3. 5.5 cm right ovarian dermoid
4. Fat containing umbilical hernia

## 2023-04-25 ENCOUNTER — Other Ambulatory Visit (HOSPITAL_BASED_OUTPATIENT_CLINIC_OR_DEPARTMENT_OTHER): Payer: Self-pay

## 2023-04-25 ENCOUNTER — Emergency Department (HOSPITAL_BASED_OUTPATIENT_CLINIC_OR_DEPARTMENT_OTHER)
Admission: EM | Admit: 2023-04-25 | Discharge: 2023-04-25 | Disposition: A | Discharge status: Home / Self Care | Attending: Emergency Medicine | Admitting: Emergency Medicine

## 2023-04-25 ENCOUNTER — Encounter (HOSPITAL_BASED_OUTPATIENT_CLINIC_OR_DEPARTMENT_OTHER): Payer: Self-pay

## 2023-04-25 ENCOUNTER — Emergency Department (HOSPITAL_BASED_OUTPATIENT_CLINIC_OR_DEPARTMENT_OTHER)

## 2023-04-25 ENCOUNTER — Other Ambulatory Visit: Payer: Self-pay

## 2023-04-25 DIAGNOSIS — R519 Headache, unspecified: Secondary | ICD-10-CM | POA: Diagnosis not present

## 2023-04-25 DIAGNOSIS — E119 Type 2 diabetes mellitus without complications: Secondary | ICD-10-CM | POA: Diagnosis not present

## 2023-04-25 DIAGNOSIS — Z7982 Long term (current) use of aspirin: Secondary | ICD-10-CM | POA: Insufficient documentation

## 2023-04-25 DIAGNOSIS — Z1152 Encounter for screening for COVID-19: Secondary | ICD-10-CM | POA: Diagnosis not present

## 2023-04-25 DIAGNOSIS — Z7984 Long term (current) use of oral hypoglycemic drugs: Secondary | ICD-10-CM | POA: Insufficient documentation

## 2023-04-25 DIAGNOSIS — Z79899 Other long term (current) drug therapy: Secondary | ICD-10-CM | POA: Diagnosis not present

## 2023-04-25 DIAGNOSIS — R0602 Shortness of breath: Secondary | ICD-10-CM | POA: Diagnosis not present

## 2023-04-25 DIAGNOSIS — I10 Essential (primary) hypertension: Secondary | ICD-10-CM | POA: Insufficient documentation

## 2023-04-25 DIAGNOSIS — R5383 Other fatigue: Secondary | ICD-10-CM | POA: Diagnosis not present

## 2023-04-25 DIAGNOSIS — R002 Palpitations: Secondary | ICD-10-CM | POA: Insufficient documentation

## 2023-04-25 LAB — D-DIMER, QUANTITATIVE: D-Dimer, Quant: <0.27 ug/mL-FEU (ref 0.00–0.50)

## 2023-04-25 LAB — DIFFERENTIAL
Abs Immature Granulocytes: 0.01 10*3/uL (ref 0.00–0.07)
Basophils Absolute: 0 10*3/uL (ref 0.0–0.1)
Basophils Relative: 0 %
Eosinophils Absolute: 0 10*3/uL (ref 0.0–0.5)
Eosinophils Relative: 1 %
Immature Granulocytes: 0 %
Lymphocytes Relative: 31 %
Lymphs Abs: 1.4 10*3/uL (ref 0.7–4.0)
Monocytes Absolute: 0.3 10*3/uL (ref 0.1–1.0)
Monocytes Relative: 7 %
Neutro Abs: 2.8 10*3/uL (ref 1.7–7.7)
Neutrophils Relative %: 61 %

## 2023-04-25 LAB — CBC
HCT: 43.4 % (ref 36.0–46.0)
Hemoglobin: 14.2 g/dL (ref 12.0–15.0)
MCH: 27.4 pg (ref 26.0–34.0)
MCHC: 32.7 g/dL (ref 30.0–36.0)
MCV: 83.8 fL (ref 80.0–100.0)
Platelets: 264 10*3/uL (ref 150–400)
RBC: 5.18 MIL/uL — ABNORMAL HIGH (ref 3.87–5.11)
RDW: 13.2 % (ref 11.5–15.5)
WBC: 4.7 10*3/uL (ref 4.0–10.5)
nRBC: 0 % (ref 0.0–0.2)

## 2023-04-25 LAB — COMPREHENSIVE METABOLIC PANEL
ALT: 22 U/L (ref 0–44)
AST: 16 U/L (ref 15–41)
Albumin: 4.4 g/dL (ref 3.5–5.0)
Alkaline Phosphatase: 95 U/L (ref 38–126)
Anion gap: 9 (ref 5–15)
BUN: 15 mg/dL (ref 6–20)
CO2: 26 mmol/L (ref 22–32)
Calcium: 10.3 mg/dL (ref 8.9–10.3)
Chloride: 104 mmol/L (ref 98–111)
Creatinine, Ser: 0.88 mg/dL (ref 0.44–1.00)
GFR, Estimated: >60 mL/min (ref >60)
Glucose, Bld: 114 mg/dL — ABNORMAL HIGH (ref 70–99)
Potassium: 3.5 mmol/L (ref 3.5–5.1)
Sodium: 139 mmol/L (ref 135–145)
Total Bilirubin: 0.8 mg/dL (ref 0.3–1.2)
Total Protein: 7.4 g/dL (ref 6.5–8.1)

## 2023-04-25 LAB — TROPONIN I (HIGH SENSITIVITY): Troponin I (High Sensitivity): <2 ng/L (ref <18)

## 2023-04-25 LAB — SARS CORONAVIRUS 2 BY RT PCR: SARS Coronavirus 2 by RT PCR: NEGATIVE

## 2023-04-25 NOTE — ED Triage Notes (Signed)
Pt ambulatory to triage. States she was at [employer] Saturday, had HA "front & sides, nearly blacked out- couldn't see/ blurred vision, nauseous, tingling in fingers/ toes." Reports she was evaluated on site, released after that. States she had some resolution but "still felt sick & had HA." Pt reports intermittent episodes of HA/ nausea since, advised by PCP to seek eval.   Endorses SHOB, fatigue "especially when moving around."

## 2023-04-25 NOTE — Discharge Instructions (Addendum)
Recheck with your PCP this week. Return to the ER for new or worsening symptoms.

## 2023-04-25 NOTE — ED Provider Notes (Signed)
EMERGENCY DEPARTMENT AT [facility] Provider Note   CSN: [id number] Arrival date & time: 04/25/23  1042     History  Chief Complaint  Patient presents with   Nausea   Headache    Amanda Kirk is a 52 y.o. female.  52 year old female with history of DM, HTN, HLD, non smoker, presents after not feeling well onset Saturday (3 days ago). Patient reports feeling nauseous Saturday morning while standing in line at [employer] to enter the part, had been standing for about 30 minutes, drank water and nausea resolved. Patient went into the park, picked up ribs and french fried and sat down in a shaded area to eat and drink water. Patient then began to feel like she was going to pass out- vision darkening but never vision loss, felt like she needed to have a bowel movement, waved to staff to seek medical attention and was wheeled to a medical clinic on site. Vitals were assessed and normal, blood sugar was normal, was allowed time to cool off and felt like she was improving. Patient was ambulatory without assistance to the bathroom. Transported to her vehicle where she sat in the [facility] while her family enjoyed the park. States while in her car she felt nauseous intermittently. Patient went home, since that day, has intermittent mild headaches, fatigue with exertion, occasional heart palpitations and occasional SHOB. No unilateral weakness/ numbness, no difficulty using arms or change in gait, no speech or word finding difficulty. Patient thought her symptoms may be related to her Simvastatin and called her PCP today to ask about dc the medication and was told her symptoms were not likely due to to her simvastatin and that she should get checked in the ER.  Patient did not ride any roller coasters while in the park and did not go on any water slide/water rides.       Home Medications Prior to Admission medications   Medication Sig Start Date End Date Taking? Authorizing Provider   dapagliflozin propanediol (FARXIGA) 10 MG TABS tablet Take 1 tablet by mouth daily. 04/18/23  Yes [provider]  Semaglutide,0.25 or 0.5MG /DOS, (OZEMPIC, 0.25 OR 0.5 MG/DOSE,) 2 MG/3ML SOPN Inject 0.5 mg into the skin once a week. 03/31/23  Yes [provider]  simvastatin (ZOCOR) 20 MG tablet Take 20 mg by mouth every evening. 03/29/23 06/27/23 Yes [provider]  aspirin-sod bicarb-citric acid (ALKA-SELTZER) 325 MG TBEF tablet Take 325 mg by mouth every 6 (six) hours as needed (sinus).    [provider]  HYDROcodone-acetaminophen (NORCO/VICODIN) 5-325 MG per tablet Take 1 tablet by mouth every 6 (six) hours as needed for pain.    [provider]  ibuprofen (ADVIL,MOTRIN) 200 MG tablet Take 400 mg by mouth daily as needed for pain.    [provider]  ketoconazole (NIZORAL) 2 % cream Apply topically 2 (two) times daily. Patient not taking: Reported on 11/14/2014 06/10/13   Elvina Sidle, MD  naproxen sodium (ANAPROX) 220 MG tablet Take 220 mg by mouth 2 (two) times daily with a meal.    [provider]  ondansetron (ZOFRAN) 4 MG tablet Take 1 tablet (4 mg total) by mouth every 6 (six) hours. 11/14/14   Santiago Glad, PA-C  oxyCODONE-acetaminophen (PERCOCET/ROXICET) 5-325 MG per tablet Take 1-2 tablets by mouth every 6 (six) hours as needed for severe pain. 11/14/14   Santiago Glad, PA-C  oxymetazoline (AFRIN) 0.05 % nasal spray Place 3 sprays into the nose 3 (  three) times daily.    [provider]  predniSONE (DELTASONE) 20 MG tablet 3-2-2-1-1-1 daily with food for shoulder tendonitis and rotator cuff strain Patient not taking: Reported on 11/14/2014 06/10/13   Elvina Sidle, MD  tamsulosin (FLOMAX) 0.4 MG CAPS capsule Take 1 capsule (0.4 mg total) by mouth daily. 11/14/14   Santiago Glad, PA-C      Allergies    Codeine    Review of Systems   Review of Systems Negative except as per HPI Physical Exam Updated  Vital Signs BP (!) 154/111   Pulse 75   Temp 98.6 F (37 C)   Resp 16   SpO2 99%  Physical Exam Vitals and nursing note reviewed.  Constitutional:      General: She is not in acute distress.    Appearance: She is well-developed. She is not diaphoretic.  HENT:     Head: Normocephalic and atraumatic.     Mouth/Throat:     Mouth: Mucous membranes are moist.     Pharynx: Oropharynx is clear.  Eyes:     General: No visual field deficit.    Extraocular Movements: Extraocular movements intact.     Pupils: Pupils are equal, round, and reactive to light.  Cardiovascular:     Rate and Rhythm: Normal rate and regular rhythm.     Heart sounds: Normal heart sounds.  Pulmonary:     Effort: Pulmonary effort is normal.     Breath sounds: Normal breath sounds.  Abdominal:     Palpations: Abdomen is soft.     Tenderness: There is no abdominal tenderness.  Musculoskeletal:        General: No swelling or tenderness.  Skin:    General: Skin is warm and dry.  Neurological:     Mental Status: She is alert and oriented to person, place, and time.     GCS: GCS eye subscore is 4. GCS verbal subscore is 5. GCS motor subscore is 6.     Cranial Nerves: No dysarthria.     Sensory: No sensory deficit.     Motor: No weakness.     Coordination: Coordination normal.     Deep Tendon Reflexes: Reflexes normal. Babinski sign absent on the right side. Babinski sign absent on the left side.     Comments: Question slight left upper lip asymmetry with speech, no facial asymmetry with smile.  Patient is not aware of this asymmetry as present prior to today.  Psychiatric:        Behavior: Behavior normal.     ED Results / Procedures / Treatments   Labs (all labs ordered are listed, but only abnormal results are displayed) Labs Reviewed  CBC - Abnormal; Notable for the following components:      Result Value   RBC 5.18 (*)    All other components within normal limits  COMPREHENSIVE METABOLIC PANEL -  Abnormal; Notable for the following components:   Glucose, Bld 114 (*)    All other components within normal limits  SARS CORONAVIRUS 2 BY RT PCR  D-DIMER, QUANTITATIVE  DIFFERENTIAL  TROPONIN I (HIGH SENSITIVITY)    EKG EKG Interpretation Date/Time:  Tuesday April 25 2023 10:48:16 EDT Ventricular Rate:  70 PR Interval:  133 QRS Duration:  84 QT Interval:  393 QTC Calculation: 424 R Axis:   24  Text Interpretation: Sinus rhythm Abnormal R-wave progression, early transition Confirmed by Alvester Chou [phone]) on 04/25/2023 12:43:15 PM  Radiology DG Chest Port 1 View  Result Date:  04/25/2023 CLINICAL DATA:  CP- palpitations EXAM: PORTABLE CHEST 1 VIEW COMPARISON:  None Available. FINDINGS: Bilateral lung fields are clear. Bilateral costophrenic angles are clear. Normal cardio-mediastinal silhouette. No acute osseous abnormalities. The soft tissues are within normal limits. IMPRESSION: No active disease. Electronically Signed   By: Jules Schick M.D.   On: 04/25/2023 11:58   CT Head Wo Contrast  Result Date: 04/25/2023 CLINICAL DATA:  Headache, nausea, visual disturbance, and tingling in the extremities. EXAM: CT HEAD WITHOUT CONTRAST TECHNIQUE: Contiguous axial images were obtained from the base of the skull through the vertex without intravenous contrast. RADIATION DOSE REDUCTION: This exam was performed according to the departmental dose-optimization program which includes automated exposure control, adjustment of the mA and/or kV according to patient size and/or use of iterative reconstruction technique. COMPARISON:  None Available. FINDINGS: Brain: There is no evidence of an acute infarct, intracranial hemorrhage, mass, midline shift, or extra-axial fluid collection. The ventricles and sulci are normal. Vascular: No hyperdense vessel. Skull: No acute fracture or suspicious osseous lesion. Sinuses/Orbits: Visualized paranasal sinuses and mastoid air cells are clear. Visualized portions  of the orbits are unremarkable. Other: None. IMPRESSION: Negative head CT. Electronically Signed   By: Sebastian Ache M.D.   On: 04/25/2023 11:58    Procedures Procedures    Medications Ordered in ED Medications - No data to display  ED Course/ Medical Decision Making/ A&P                                 Medical Decision Making Amount and/or Complexity of Data Reviewed Labs: ordered. Radiology: ordered.   This patient presents to the ED for concern of fatigue, SHOB, near syncope, this involves an extensive number of treatment options, and is a complaint that carries with it a high risk of complications and morbidity.  The differential diagnosis includes but not limited to PE, ACS, arrhythmia, CVA, TIA, metabolic abnormality   Co morbidities that complicate the patient evaluation  HLD, DM, HTN   Additional history obtained:  External records from outside source obtained and reviewed including prior labs on file, prior EKG   Lab Tests:  I Ordered, and personally interpreted labs.  The pertinent results include: CBC without significant findings.  CMP within normal limits, nonfasting.  Troponin is less than 2.  D-dimer is negative.  COVID-negative.   Imaging Studies ordered:  I ordered imaging studies including chest x-ray, CT head I independently visualized and interpreted imaging which showed no acute abnormality I agree with the radiologist interpretation   Cardiac Monitoring: / EKG:  The patient was maintained on a cardiac monitor.  I personally viewed and interpreted the cardiac monitored which showed an underlying rhythm of: Sinus rhythm, rate 70   Consultations Obtained:  I requested consultation with the attending, Dr. Renaye Rakers,  and discussed lab and imaging findings as well as pertinent plan - they recommend: Has evaluated the patient, agrees with workup and plan of care   Problem List / ED Course / Critical interventions / Medication management  52 year old  female presents with complaint as above.  Exam is unremarkable.  Workup reassuring.  Consider possible viral illness.  Patient started her simvastatin 5 days ago, 2 days prior to onset of her symptoms.  She is concerned that her simvastatin is causing her symptoms.  Advised to recheck with her PCP, continue medication until advised by her PCP. I have reviewed the patients home medicines and  have made adjustments as needed   Social Determinants of Health:  Has PCP   Test / Admission - Considered:  Stable for discharge with plan to follow-up, return precautions provided         Final Clinical Impression(s) / ED Diagnoses Final diagnoses:  Other fatigue  Palpitations  Shortness of breath    Rx / DC Orders ED Discharge Orders     None         Jeannie Fend, PA-C 04/25/23 1335    Terald Sleeper, MD 04/25/23 1420

## 2024-03-09 ENCOUNTER — Emergency Department (HOSPITAL_BASED_OUTPATIENT_CLINIC_OR_DEPARTMENT_OTHER)

## 2024-03-09 ENCOUNTER — Other Ambulatory Visit: Payer: Self-pay

## 2024-03-09 ENCOUNTER — Encounter (HOSPITAL_BASED_OUTPATIENT_CLINIC_OR_DEPARTMENT_OTHER): Payer: Self-pay | Admitting: Emergency Medicine

## 2024-03-09 ENCOUNTER — Emergency Department (HOSPITAL_BASED_OUTPATIENT_CLINIC_OR_DEPARTMENT_OTHER)
Admission: EM | Admit: 2024-03-09 | Discharge: 2024-03-09 | Disposition: A | Discharge status: Home / Self Care | Attending: Emergency Medicine | Admitting: Emergency Medicine

## 2024-03-09 DIAGNOSIS — R109 Unspecified abdominal pain: Secondary | ICD-10-CM | POA: Diagnosis present

## 2024-03-09 DIAGNOSIS — N200 Calculus of kidney: Secondary | ICD-10-CM | POA: Diagnosis not present

## 2024-03-09 DIAGNOSIS — E876 Hypokalemia: Secondary | ICD-10-CM | POA: Diagnosis not present

## 2024-03-09 LAB — BASIC METABOLIC PANEL WITH GFR
Anion gap: 12 (ref 5–15)
BUN: 10 mg/dL (ref 6–20)
CO2: 26 mmol/L (ref 22–32)
Calcium: 10.3 mg/dL (ref 8.9–10.3)
Chloride: 103 mmol/L (ref 98–111)
Creatinine, Ser: 0.83 mg/dL (ref 0.44–1.00)
GFR, Estimated: >60 mL/min (ref >60)
Glucose, Bld: 107 mg/dL — ABNORMAL HIGH (ref 70–99)
Potassium: 3.4 mmol/L — ABNORMAL LOW (ref 3.5–5.1)
Sodium: 140 mmol/L (ref 135–145)

## 2024-03-09 LAB — URINALYSIS, ROUTINE W REFLEX MICROSCOPIC
Bacteria, UA: NONE SEEN
Bilirubin Urine: NEGATIVE
Glucose, UA: >1000 mg/dL — AB
Ketones, ur: NEGATIVE mg/dL
Leukocytes,Ua: NEGATIVE
Nitrite: NEGATIVE
Protein, ur: NEGATIVE mg/dL
Specific Gravity, Urine: 1.025 (ref 1.005–1.030)
pH: 6.5 (ref 5.0–8.0)

## 2024-03-09 LAB — CBC
HCT: 40.6 % (ref 36.0–46.0)
Hemoglobin: 13.2 g/dL (ref 12.0–15.0)
MCH: 27.6 pg (ref 26.0–34.0)
MCHC: 32.5 g/dL (ref 30.0–36.0)
MCV: 84.9 fL (ref 80.0–100.0)
Platelets: 231 10*3/uL (ref 150–400)
RBC: 4.78 MIL/uL (ref 3.87–5.11)
RDW: 13 % (ref 11.5–15.5)
WBC: 4.9 10*3/uL (ref 4.0–10.5)
nRBC: 0 % (ref 0.0–0.2)

## 2024-03-09 LAB — MAGNESIUM: Magnesium: 2.1 mg/dL (ref 1.7–2.4)

## 2024-03-09 MED ORDER — ONDANSETRON HCL 4 MG/2ML IJ SOLN
4.0000 mg | Freq: Once | INTRAMUSCULAR | Status: AC
  Administered 2024-03-09: 4 mg via INTRAVENOUS
  Filled 2024-03-09: qty 2

## 2024-03-09 MED ORDER — POTASSIUM CHLORIDE CRYS ER 20 MEQ PO TBCR
20.0000 meq | EXTENDED_RELEASE_TABLET | Freq: Once | ORAL | Status: AC
  Administered 2024-03-09: 20 meq via ORAL
  Filled 2024-03-09: qty 1

## 2024-03-09 MED ORDER — KETOROLAC TROMETHAMINE 15 MG/ML IJ SOLN
15.0000 mg | Freq: Once | INTRAMUSCULAR | Status: AC
  Administered 2024-03-09: 15 mg via INTRAVENOUS
  Filled 2024-03-09: qty 1

## 2024-03-09 MED ORDER — FENTANYL CITRATE PF 50 MCG/ML IJ SOSY
50.0000 ug | PREFILLED_SYRINGE | Freq: Once | INTRAMUSCULAR | Status: AC
  Administered 2024-03-09: 50 ug via INTRAVENOUS
  Filled 2024-03-09: qty 1

## 2024-03-09 MED ORDER — TAMSULOSIN HCL 0.4 MG PO CAPS
0.4000 mg | ORAL_CAPSULE | Freq: Once | ORAL | Status: AC
  Administered 2024-03-09: 0.4 mg via ORAL
  Filled 2024-03-09: qty 1

## 2024-03-09 MED ORDER — HYDROCODONE-ACETAMINOPHEN 5-325 MG PO TABS: 1.0000 | ORAL_TABLET | Freq: Four times a day (QID) | ORAL | 0 refills | Status: AC | PRN

## 2024-03-09 NOTE — ED Notes (Signed)
 Patient not able to give a UA at this time

## 2024-03-09 NOTE — ED Provider Notes (Signed)
  EMERGENCY DEPARTMENT AT [facility] Provider Note   CSN: [id number] Arrival date & time: 03/09/24  4098     Patient presents with: Flank Pain   Amanda Kirk is a 53 y.o. female.    Flank Pain     53 year old female with medical history significant for nephrolithiasis presenting to the emergency department with left-sided flank pain.  The patient states that pain has been ongoing since this past Tuesday.  Normally she can pass stones outpatient without significant discomfort.  She is on Flomax , has not taken any medication today.  She states the pain worsened overnight and that prompted her presentation to the emergency department.  She denies any nausea or vomiting.  She denies any genitourinary symptoms.  She denies any fevers, does endorse chills.  Prior to Admission medications   Medication Sig Start Date End Date Taking? Authorizing Provider  aspirin-sod bicarb-citric acid (ALKA-SELTZER) 325 MG TBEF tablet Take 325 mg by mouth every 6 (six) hours as needed (sinus).    [provider]  dapagliflozin propanediol (FARXIGA) 10 MG TABS tablet Take 1 tablet by mouth daily. 04/18/23   [provider]  HYDROcodone-acetaminophen  (NORCO/VICODIN) 5-325 MG per tablet Take 1 tablet by mouth every 6 (six) hours as needed for pain.    [provider]  ibuprofen (ADVIL,MOTRIN) 200 MG tablet Take 400 mg by mouth daily as needed for pain.    [provider]  ketoconazole  (NIZORAL ) 2 % cream Apply topically 2 (two) times daily. Patient not taking: Reported on 11/14/2014 06/10/13   Dain Drown, MD  naproxen sodium (ANAPROX) 220 MG tablet Take 220 mg by mouth 2 (two) times daily with a meal.    [provider]  ondansetron  (ZOFRAN ) 4 MG tablet Take 1 tablet (4 mg total) by mouth every 6 (six) hours. 11/14/14   Letcher Rattler, PA-C  oxyCODONE -acetaminophen  (PERCOCET/ROXICET) 5-325 MG per tablet Take 1-2 tablets by mouth every 6 (six)  hours as needed for severe pain. 11/14/14   Letcher Rattler, PA-C  oxymetazoline (AFRIN) 0.05 % nasal spray Place 3 sprays into the nose 3 (three) times daily.    [provider]  predniSONE  (DELTASONE ) 20 MG tablet 3-2-2-1-1-1 daily with food for shoulder tendonitis and rotator cuff strain Patient not taking: Reported on 11/14/2014 06/10/13   Dain Drown, MD  Semaglutide,0.25 or 0.5MG /DOS, (OZEMPIC, 0.25 OR 0.5 MG/DOSE,) 2 MG/3ML SOPN Inject 0.5 mg into the skin once a week. 03/31/23   [provider]  simvastatin (ZOCOR) 20 MG tablet Take 20 mg by mouth every evening. 03/29/23 06/27/23  [provider]  tamsulosin  (FLOMAX ) 0.4 MG CAPS capsule Take 1 capsule (0.4 mg total) by mouth daily. 11/14/14   Letcher Rattler, PA-C    Allergies: Codeine    Review of Systems  Genitourinary:  Positive for flank pain.  All other systems reviewed and are negative.   Updated Vital Signs BP 130/88   Pulse 69   Temp (!) 97 F (36.1 C) (Oral)   Resp 18   SpO2 100%   Physical Exam Vitals and nursing note reviewed.  Constitutional:      General: She is not in acute distress.    Appearance: She is well-developed.  HENT:     Head: Normocephalic and atraumatic.   Eyes:     Conjunctiva/sclera: Conjunctivae normal.    Cardiovascular:     Rate and Rhythm: Normal rate and regular rhythm.     Heart sounds: No murmur heard. Pulmonary:  Effort: Pulmonary effort is normal. No respiratory distress.     Breath sounds: Normal breath sounds.  Abdominal:     Palpations: Abdomen is soft.     Tenderness: There is no abdominal tenderness.   Musculoskeletal:        General: No swelling.     Cervical back: Neck supple.   Skin:    General: Skin is warm and dry.     Capillary Refill: Capillary refill takes less than 2 seconds.   Neurological:     Mental Status: She is alert.   Psychiatric:        Mood and Affect: Mood normal.     (all labs ordered are listed, but only  abnormal results are displayed) Labs Reviewed  BASIC METABOLIC PANEL WITH GFR - Abnormal; Notable for the following components:      Result Value   Potassium 3.4 (*)    Glucose, Bld 107 (*)    All other components within normal limits  CBC  URINALYSIS, ROUTINE W REFLEX MICROSCOPIC  MAGNESIUM    EKG: None  Radiology: CT RENAL STONE STUDY Result Date: 03/09/2024 CLINICAL DATA:  Abdominal/flank pain, stone suspected EXAM: CT ABDOMEN AND PELVIS WITHOUT CONTRAST TECHNIQUE: Multidetector CT imaging of the abdomen and pelvis was performed following the standard protocol without IV contrast. RADIATION DOSE REDUCTION: This exam was performed according to the departmental dose-optimization program which includes automated exposure control, adjustment of the mA and/or kV according to patient size and/or use of iterative reconstruction technique. COMPARISON:  November 14, 2014 FINDINGS: Evaluation is limited by lack of IV contrast. Lower chest: No acute abnormality. Hepatobiliary: Status post cholecystectomy. No focal hepatic abnormality. Low-attenuation of the liver could reflect underlying hepatic steatosis. Pancreas: No peripancreatic fat stranding. Spleen: Unremarkable. Adrenals/Urinary Tract: Adrenal glands are unremarkable. No hydronephrosis. Multiple bilateral nonobstructing nephrolithiasis. Largest on the LEFT measures 7 mm and largest on the RIGHT measures 5 mm. Bladder is decompressed, limiting evaluation. There is a 6 mm radiopaque density at the expected location of the LEFT ureterovesicular junction. Multiple pelvic phleboliths and decompressed bladder limit evaluation. Stomach/Bowel: No evidence of bowel obstruction. Diverticulosis. Moderate colonic stool burden throughout the RIGHT and transverse colon. Tiny hiatal hernia. Stomach is decompressed with mildly prominent gastric folds. Vascular/Lymphatic: No significant vascular findings are present. No enlarged abdominal or pelvic lymph nodes.  Reproductive: Status post hysterectomy. No adnexal masses. Other: Small fat containing periumbilical hernia. No free air or free fluid. Musculoskeletal: No acute or significant osseous findings. IMPRESSION: 1. There is a 6 mm radiopaque density at the expected location of the LEFT ureterovesicular junction. This is favored to reflect a ureterolithiasis given LEFT-sided symptomatology. 2. Multiple additional bilateral nonobstructing nephrolithiasis. 3. Stomach is decompressed with mildly prominent gastric folds. This could reflect gastritis in the appropriate clinical setting. 4. Possible hepatic steatosis. Electronically Signed   By: Clancy Crimes M.D.   On: 03/09/2024 11:02     Procedures   Medications Ordered in the ED  fentaNYL  (SUBLIMAZE ) injection 50 mcg (has no administration in time range)  ketorolac (TORADOL) 15 MG/ML injection 15 mg (has no administration in time range)  potassium chloride  SA (KLOR-CON  M) CR tablet 20 mEq (has no administration in time range)  tamsulosin  (FLOMAX ) capsule 0.4 mg (has no administration in time range)                                    Medical Decision  Making Amount and/or Complexity of Data Reviewed Labs: ordered. Radiology: ordered.  Risk Prescription drug management.    53 year old female with medical history significant for nephrolithiasis presenting to the emergency department with left-sided flank pain.  The patient states that pain has been ongoing since this past Tuesday.  Normally she can pass stones outpatient without significant discomfort.  She is on Flomax , has not taken any medication today.  She states the pain worsened overnight and that prompted her presentation to the emergency department.  She denies any nausea or vomiting.  She denies any genitourinary symptoms.  She denies any fevers, does endorse chills.  On arrival, the patient was vitally stable, afebrile, temperature 97, heart rate 71, respiratory rate 18, BP initially  182/11, improved to 130/88, saturating 100% on room air.  Physical exam generally unremarkable.  Concern for nephrolithiasis given the patient's HPI.  Labs obtained in addition to CT imaging.  Labs: CBC without a leukocytosis or anemia, BMP without AKI, mild hypokalemia 3.4, replenished orally.  Urinalysis collected and showed hematuria, not UTI.  CT Stone: IMPRESSION:  1. There is a 6 mm radiopaque density at the expected location of  the LEFT ureterovesicular junction. This is favored to reflect a  ureterolithiasis given LEFT-sided symptomatology.  2. Multiple additional bilateral nonobstructing nephrolithiasis.  3. Stomach is decompressed with mildly prominent gastric folds. This  could reflect gastritis in the appropriate clinical setting.  4. Possible hepatic steatosis.   Pt was feeling symptomatically improved on repeat assessment. Norco provided for breakthrough pain, pt advised to f/u with her Urologist.     Final diagnoses:  Kidney stone    ED Discharge Orders     None          Rosealee Concha, MD 03/09/24 1320

## 2024-03-09 NOTE — Discharge Instructions (Addendum)
 Your CT showed a 6mm kidney which should pass on its own. Take flomax  and follow up with your Urologist. Norco has been prescribed for breakthrough pain

## 2024-03-09 NOTE — ED Triage Notes (Signed)
 C/o left sided flank pain x 5 days, Hx of kidney stones. States :feels the same as they always do. Denies fever.

## 2024-05-30 ENCOUNTER — Encounter (HOSPITAL_BASED_OUTPATIENT_CLINIC_OR_DEPARTMENT_OTHER): Payer: Self-pay | Admitting: Emergency Medicine

## 2024-05-30 ENCOUNTER — Other Ambulatory Visit: Payer: Self-pay

## 2024-05-30 ENCOUNTER — Emergency Department (HOSPITAL_BASED_OUTPATIENT_CLINIC_OR_DEPARTMENT_OTHER): Admitting: Radiology

## 2024-05-30 ENCOUNTER — Emergency Department (HOSPITAL_BASED_OUTPATIENT_CLINIC_OR_DEPARTMENT_OTHER)
Admission: EM | Admit: 2024-05-30 | Discharge: 2024-05-30 | Disposition: A | Discharge status: Home / Self Care | Attending: Emergency Medicine | Admitting: Emergency Medicine

## 2024-05-30 DIAGNOSIS — Z7984 Long term (current) use of oral hypoglycemic drugs: Secondary | ICD-10-CM | POA: Insufficient documentation

## 2024-05-30 DIAGNOSIS — I10 Essential (primary) hypertension: Secondary | ICD-10-CM | POA: Insufficient documentation

## 2024-05-30 DIAGNOSIS — R079 Chest pain, unspecified: Secondary | ICD-10-CM | POA: Insufficient documentation

## 2024-05-30 DIAGNOSIS — E119 Type 2 diabetes mellitus without complications: Secondary | ICD-10-CM | POA: Insufficient documentation

## 2024-05-30 DIAGNOSIS — Z794 Long term (current) use of insulin: Secondary | ICD-10-CM | POA: Diagnosis not present

## 2024-05-30 LAB — BASIC METABOLIC PANEL WITH GFR
Anion gap: 11 (ref 5–15)
BUN: 10 mg/dL (ref 6–20)
CO2: 22 mmol/L (ref 22–32)
Calcium: 10.3 mg/dL (ref 8.9–10.3)
Chloride: 104 mmol/L (ref 98–111)
Creatinine, Ser: 0.87 mg/dL (ref 0.44–1.00)
GFR, Estimated: >60 mL/min (ref >60)
Glucose, Bld: 106 mg/dL — ABNORMAL HIGH (ref 70–99)
Potassium: 3.6 mmol/L (ref 3.5–5.1)
Sodium: 137 mmol/L (ref 135–145)

## 2024-05-30 LAB — CBC
HCT: 42.6 % (ref 36.0–46.0)
Hemoglobin: 14.1 g/dL (ref 12.0–15.0)
MCH: 27.9 pg (ref 26.0–34.0)
MCHC: 33.1 g/dL (ref 30.0–36.0)
MCV: 84.2 fL (ref 80.0–100.0)
Platelets: 272 K/uL (ref 150–400)
RBC: 5.06 MIL/uL (ref 3.87–5.11)
RDW: 12.9 % (ref 11.5–15.5)
WBC: 4.8 K/uL (ref 4.0–10.5)
nRBC: 0 % (ref 0.0–0.2)

## 2024-05-30 LAB — HEPATIC FUNCTION PANEL
ALT: 17 U/L (ref 0–44)
AST: 17 U/L (ref 15–41)
Albumin: 4.5 g/dL (ref 3.5–5.0)
Alkaline Phosphatase: 127 U/L — ABNORMAL HIGH (ref 38–126)
Bilirubin, Direct: 0.2 mg/dL (ref 0.0–0.2)
Indirect Bilirubin: 0.4 mg/dL (ref 0.3–0.9)
Total Bilirubin: 0.5 mg/dL (ref 0.0–1.2)
Total Protein: 7.6 g/dL (ref 6.5–8.1)

## 2024-05-30 LAB — D-DIMER, QUANTITATIVE: D-Dimer, Quant: <0.27 ug{FEU}/mL (ref 0.00–0.50)

## 2024-05-30 LAB — LIPASE, BLOOD: Lipase: 31 U/L (ref 11–51)

## 2024-05-30 LAB — TROPONIN T, HIGH SENSITIVITY
Troponin T High Sensitivity: <15 ng/L (ref 0–19)
Troponin T High Sensitivity: <15 ng/L (ref 0–19)

## 2024-05-30 MED ORDER — ALUM & MAG HYDROXIDE-SIMETH 200-200-20 MG/5ML PO SUSP
30.0000 mL | Freq: Once | ORAL | Status: AC
  Administered 2024-05-30: 30 mL via ORAL
  Filled 2024-05-30: qty 30

## 2024-05-30 MED ORDER — FAMOTIDINE 20 MG PO TABS
20.0000 mg | ORAL_TABLET | Freq: Once | ORAL | Status: AC
Start: 2024-05-30 — End: 2024-05-30
  Administered 2024-05-30: 20 mg via ORAL
  Filled 2024-05-30: qty 1

## 2024-05-30 NOTE — Discharge Instructions (Addendum)
 Your evaluation today has been very reassuring, EKG and heart enzymes are normal and do not show signs of a heart attack, blood tests did not show signs of a blood clot either.  Symptoms could be related to muscle strain or acid reflux.  You can use ibuprofen for muscle pain and Pepcid  and Maalox to help with acid reflux symptoms.  Please follow-up closely with your primary care doctor if you continue to experience these pains.  If you have worsening chest pain, shortness of breath, feel like you are get a pass out or have other new or concerning symptoms return for reevaluation.

## 2024-05-30 NOTE — ED Triage Notes (Signed)
 Pt ambulatory to triage, c/o central CP x a week and a half. Denies n/v, no radiation, mild shob. Took omeprazole at 0600 with no relief

## 2024-05-30 NOTE — ED Notes (Signed)
 Called lab to add d-dimer, hepatic panel, and lipase, spoke with Warren.

## 2024-05-30 NOTE — ED Provider Notes (Signed)
 Amanda Kirk Provider Note   CSN: [id number] Arrival date & time: 05/30/24  1136     Patient presents with: Chest Pain   Amanda Kirk is a 53 y.o. female.   Amanda Kirk is a 53 y.o. female with a history of hypertension and type 2 diabetes, who presents to the emergency department for evaluation of chest pain.  Patient reports central and right-sided chest pain over the past week and a half.  She reports it is a constant dull ache.  She reports some radiation to the back but no pain in the arm neck or jaw.  No associated nausea, vomiting or diaphoresis.  No shortness of breath.  Nothing seems to make pain better or worse it is not worse with exertion or deep breath and is not made worse with food or after eating.  She has tried Tylenol  without improvement.  Did take 1 dose of omeprazole today as well without improvement.  No history of similar pains.  No personal cardiac history and she has been taking her blood pressure and diabetes medications regularly.  No recent long distance travel or surgery, no personal or family history of blood clots.  No other aggravating or alleviating factors.   Chest Pain Associated symptoms: no abdominal pain, no fever, no nausea, no palpitations, no shortness of breath and no vomiting        Prior to Admission medications   Medication Sig Start Date End Date Taking? Authorizing Provider  aspirin-sod bicarb-citric acid (ALKA-SELTZER) 325 MG TBEF tablet Take 325 mg by mouth every 6 (six) hours as needed (sinus).    [provider]  dapagliflozin propanediol (FARXIGA) 10 MG TABS tablet Take 1 tablet by mouth daily. 04/18/23   [provider]  HYDROcodone -acetaminophen  (NORCO/VICODIN) 5-325 MG tablet Take 1-2 tablets by mouth every 6 (six) hours as needed. 03/09/24   Jerrol Agent, MD  ibuprofen (ADVIL,MOTRIN) 200 MG tablet Take 400 mg by mouth daily as needed for pain.    [provider]  ketoconazole  (NIZORAL ) 2 % cream Apply topically 2 (two) times daily. Patient not taking: Reported on 11/14/2014 06/10/13   Mario Million, MD  naproxen sodium (ANAPROX) 220 MG tablet Take 220 mg by mouth 2 (two) times daily with a meal.    [provider]  ondansetron  (ZOFRAN ) 4 MG tablet Take 1 tablet (4 mg total) by mouth every 6 (six) hours. 11/14/14   Nasario Moats, PA-C  oxyCODONE -acetaminophen  (PERCOCET/ROXICET) 5-325 MG per tablet Take 1-2 tablets by mouth every 6 (six) hours as needed for severe pain. 11/14/14   Nasario Moats, PA-C  oxymetazoline (AFRIN) 0.05 % nasal spray Place 3 sprays into the nose 3 (three) times daily.    [provider]  predniSONE  (DELTASONE ) 20 MG tablet 3-2-2-1-1-1 daily with food for shoulder tendonitis and rotator cuff strain Patient not taking: Reported on 11/14/2014 06/10/13   Mario Million, MD  Semaglutide,0.25 or 0.5MG /DOS, (OZEMPIC, 0.25 OR 0.5 MG/DOSE,) 2 MG/3ML SOPN Inject 0.5 mg into the skin once a week. 03/31/23   [provider]  simvastatin (ZOCOR) 20 MG tablet Take 20 mg by mouth every evening. 03/29/23 06/27/23  [provider]  tamsulosin  (FLOMAX ) 0.4 MG CAPS capsule Take 1 capsule (0.4 mg total) by mouth daily. 11/14/14   Nasario Moats, PA-C    Allergies: Codeine    Review of Systems  Constitutional:  Negative for chills and fever.  Respiratory:  Negative for shortness of breath.  Cardiovascular:  Positive for chest pain. Negative for palpitations and leg swelling.  Gastrointestinal:  Negative for abdominal pain, nausea and vomiting.    Updated Vital Signs BP (!) 151/102 (BP Location: Right Arm)   Pulse 71   Temp 98.5 F (36.9 C) (Oral)   Resp 15   Wt 71.2 kg   SpO2 100%   Physical Exam Vitals and nursing note reviewed.  Constitutional:      General: She is not in acute distress.    Appearance: Normal appearance. She is well-developed. She is not diaphoretic.  HENT:     Head:  Normocephalic and atraumatic.  Eyes:     General:        Right eye: No discharge.        Left eye: No discharge.     Pupils: Pupils are equal, round, and reactive to light.  Cardiovascular:     Rate and Rhythm: Normal rate and regular rhythm.     Pulses: Normal pulses.          Radial pulses are 2+ on the right side and 2+ on the left side.       Dorsalis pedis pulses are 2+ on the right side and 2+ on the left side.     Heart sounds: Normal heart sounds. No murmur heard.    No friction rub. No gallop.  Pulmonary:     Effort: Pulmonary effort is normal. No respiratory distress.     Breath sounds: Normal breath sounds. No wheezing or rales.     Comments: Respirations equal and unlabored, patient able to speak in full sentences, lungs clear to auscultation bilaterally  Chest:     Chest wall: No tenderness.  Abdominal:     General: Bowel sounds are normal. There is no distension.     Palpations: Abdomen is soft. There is no mass.     Tenderness: There is no abdominal tenderness. There is no guarding.     Comments: Abdomen soft, nondistended, nontender to palpation in all quadrants without guarding or peritoneal signs  Musculoskeletal:        General: No deformity.     Cervical back: Neck supple.     Right lower leg: No edema.     Left lower leg: No edema.  Skin:    General: Skin is warm and dry.     Capillary Refill: Capillary refill takes less than 2 seconds.  Neurological:     Mental Status: She is alert and oriented to person, place, and time.     Coordination: Coordination normal.     Comments: Speech is clear, able to follow commands Moves extremities without ataxia, coordination intact  Psychiatric:        Mood and Affect: Mood normal.        Behavior: Behavior normal.     (all labs ordered are listed, but only abnormal results are displayed) Labs Reviewed  BASIC METABOLIC PANEL WITH GFR - Abnormal; Notable for the following components:      Result Value   Glucose,  Bld 106 (*)    All other components within normal limits  HEPATIC FUNCTION PANEL - Abnormal; Notable for the following components:   Alkaline Phosphatase 127 (*)    All other components within normal limits  CBC  LIPASE, BLOOD  D-DIMER, QUANTITATIVE (NOT AT [facility])  TROPONIN T, HIGH SENSITIVITY  TROPONIN T, HIGH SENSITIVITY    EKG: EKG Interpretation Date/Time:  Thursday May 30 2024 11:49:41 EDT Ventricular Rate:  70 PR  Interval:  134 QRS Duration:  83 QT Interval:  380 QTC Calculation: 410 R Axis:   50  Text Interpretation: Sinus rhythm Confirmed by Zackowski, Scott [phone]) on 05/30/2024 5:09:39 PM  Radiology: [facility] Chest 2 View Result Date: 05/30/2024 CLINICAL DATA:  Chest pain. EXAM: CHEST - 2 VIEW COMPARISON:  April 25, 2023. FINDINGS: The heart size and mediastinal contours are within normal limits. Both lungs are clear. The visualized skeletal structures are unremarkable. IMPRESSION: No active cardiopulmonary disease. Electronically Signed   By: Lynwood Landy Raddle M.D.   On: 05/30/2024 12:44     Procedures   Medications Ordered in the ED  famotidine  (PEPCID ) tablet 20 mg (20 mg Oral Given 05/30/24 1608)  alum & mag hydroxide-simeth (MAALOX/MYLANTA) 200-200-20 MG/5ML suspension 30 mL (30 mLs Oral Given 05/30/24 1608)                                    Medical Decision Making Amount and/or Complexity of Data Reviewed Labs: ordered. Radiology: ordered.  Risk OTC drugs.   Patient presents to the emergency department with chest pain. Patient nontoxic appearing, in no apparent distress, vitals without significant abnormality. Fairly benign physical exam.   DDX including but not limited to: ACS, pulmonary embolism, dissection, pneumothorax, pneumonia, arrhythmia, severe anemia, MSK, GERD, anxiety, abdominal process.   Additional history obtained:  Chart & nursing note reviewed.    EKG: NSR without ischemic changes  Lab Tests:  I reviewed & interpreted labs including:   Normal blood counts no anemia or leukocytosis, no significant electrolyte derangements, normal renal and liver function with the exception of a very slightly elevated alk phos, normal lipase, troponins negative x 2, D-dimer is not elevated.  Imaging Studies ordered:  I ordered and viewed the following imaging, agree with radiologist impression:  Chest x-ray without active cardiopulmonary disease  ED Course:  I ordered medications including Pepcid  and Maalox for chest pain  RE-EVAL: Pain has resolved  EKG without obvious acute ischemia, delta troponin negative, doubt ACS. Patient is low risk wells, D-dimer negative, doubt pulmonary embolism. Pain is not a tearing sensation, symmetric pulses, no widening of mediastinum on CXR, doubt dissection. Cardiac monitor reviewed, no notable arrhythmias or tachycardia   Based on patient's chief complaint, I considered admission might be necessary, however after reassuring ED workup feel patient is reasonable for discharge.   I discussed results, treatment plan, need for PCP follow-up, and return precautions with the patient. Provided opportunity for questions, patient confirmed understanding and is in agreement with plan.     Portions of this note were generated with Scientist, clinical (histocompatibility and immunogenetics). Dictation errors may occur despite best attempts at proofreading.      Final diagnoses:  Right-sided chest pain    ED Discharge Orders     None          Alva Larraine JULIANNA DEVONNA 05/30/24 1711    Geraldene Hamilton, MD 05/31/24 [phone]

## 2024-09-11 ENCOUNTER — Encounter (HOSPITAL_BASED_OUTPATIENT_CLINIC_OR_DEPARTMENT_OTHER): Payer: Self-pay

## 2024-09-11 ENCOUNTER — Emergency Department (HOSPITAL_BASED_OUTPATIENT_CLINIC_OR_DEPARTMENT_OTHER)
Admission: EM | Admit: 2024-09-11 | Discharge: 2024-09-11 | Disposition: A | Discharge status: Home / Self Care | Attending: Emergency Medicine | Admitting: Emergency Medicine

## 2024-09-11 ENCOUNTER — Other Ambulatory Visit: Payer: Self-pay

## 2024-09-11 ENCOUNTER — Emergency Department (HOSPITAL_BASED_OUTPATIENT_CLINIC_OR_DEPARTMENT_OTHER)

## 2024-09-11 DIAGNOSIS — N132 Hydronephrosis with renal and ureteral calculous obstruction: Secondary | ICD-10-CM | POA: Insufficient documentation

## 2024-09-11 DIAGNOSIS — Z79899 Other long term (current) drug therapy: Secondary | ICD-10-CM | POA: Diagnosis not present

## 2024-09-11 DIAGNOSIS — R109 Unspecified abdominal pain: Secondary | ICD-10-CM | POA: Diagnosis present

## 2024-09-11 DIAGNOSIS — N23 Unspecified renal colic: Secondary | ICD-10-CM

## 2024-09-11 DIAGNOSIS — Z7982 Long term (current) use of aspirin: Secondary | ICD-10-CM | POA: Diagnosis not present

## 2024-09-11 LAB — CBC WITH DIFFERENTIAL/PLATELET
Abs Immature Granulocytes: 0.01 K/uL (ref 0.00–0.07)
Basophils Absolute: 0 K/uL (ref 0.0–0.1)
Basophils Relative: 0 %
Eosinophils Absolute: 0.1 K/uL (ref 0.0–0.5)
Eosinophils Relative: 1 %
HCT: 40.7 % (ref 36.0–46.0)
Hemoglobin: 13.4 g/dL (ref 12.0–15.0)
Immature Granulocytes: 0 %
Lymphocytes Relative: 30 %
Lymphs Abs: 1.5 K/uL (ref 0.7–4.0)
MCH: 27.3 pg (ref 26.0–34.0)
MCHC: 32.9 g/dL (ref 30.0–36.0)
MCV: 82.9 fL (ref 80.0–100.0)
Monocytes Absolute: 0.3 K/uL (ref 0.1–1.0)
Monocytes Relative: 6 %
Neutro Abs: 3.2 K/uL (ref 1.7–7.7)
Neutrophils Relative %: 63 %
Platelets: 253 K/uL (ref 150–400)
RBC: 4.91 MIL/uL (ref 3.87–5.11)
RDW: 13.3 % (ref 11.5–15.5)
WBC: 5.1 K/uL (ref 4.0–10.5)
nRBC: 0 % (ref 0.0–0.2)

## 2024-09-11 LAB — URINALYSIS, ROUTINE W REFLEX MICROSCOPIC
Bacteria, UA: NONE SEEN
Bilirubin Urine: NEGATIVE
Glucose, UA: >1000 mg/dL — AB
Ketones, ur: NEGATIVE mg/dL
Leukocytes,Ua: NEGATIVE
Nitrite: NEGATIVE
RBC / HPF: >50 RBC/hpf (ref 0–5)
Specific Gravity, Urine: 1.029 (ref 1.005–1.030)
pH: 5.5 (ref 5.0–8.0)

## 2024-09-11 LAB — BASIC METABOLIC PANEL WITH GFR
Anion gap: 10 (ref 5–15)
BUN: 10 mg/dL (ref 6–20)
CO2: 24 mmol/L (ref 22–32)
Calcium: 10.6 mg/dL — ABNORMAL HIGH (ref 8.9–10.3)
Chloride: 107 mmol/L (ref 98–111)
Creatinine, Ser: 0.68 mg/dL (ref 0.44–1.00)
GFR, Estimated: >60 mL/min (ref >60)
Glucose, Bld: 103 mg/dL — ABNORMAL HIGH (ref 70–99)
Potassium: 3.8 mmol/L (ref 3.5–5.1)
Sodium: 141 mmol/L (ref 135–145)

## 2024-09-11 MED ORDER — KETOROLAC TROMETHAMINE 10 MG PO TABS: 10.0000 mg | ORAL_TABLET | Freq: Four times a day (QID) | ORAL | 0 refills | Status: AC | PRN

## 2024-09-11 MED ORDER — OXYCODONE HCL 5 MG PO TABS: 5.0000 mg | ORAL_TABLET | Freq: Four times a day (QID) | ORAL | 0 refills | Status: AC | PRN

## 2024-09-11 NOTE — ED Triage Notes (Signed)
 She c/o 2-3 day hx of right flank area discomfort. She cites hx of kidney stone. She denies fever/dysuria/n/v/d and is in no distress.

## 2024-09-11 NOTE — ED Notes (Signed)
 Reviewed AVS/discharge instruction with patient. Time allotted for and all questions answered. Patient is agreeable for d/c and escorted to ed exit by staff.

## 2024-09-11 NOTE — ED Provider Notes (Signed)
 [place] EMERGENCY DEPARTMENT AT [facility] Provider Note   CSN: [id number] Arrival date & time: 09/11/24  9076     Patient presents with: Flank Pain   Amanda Kirk is a 53 y.o. female.   Patient with history of kidney stone, previous cholecystectomy, hysterectomy --presents to the emergency department for evaluation of hematuria, right-sided flank pain that is waxing and waning over the past few days.  Symptoms started with bleeding and noted in the urine 5 days ago.  Patient has had severe pain at times, and vomiting with severe pain.  No fevers.  No diarrhea or constipation.  She states that she wanted to give her symptoms a few days to improve, given that she has had more than 10 kidney stones over the past few years.  She reports having 4 surgical procedures for kidney stone removal.  However, symptoms are not improving, so she wanted to get checked today.  She is taking ibuprofen at home for pain.  She is on tamsulosin  daily prescribed by her urologist.       Prior to Admission medications  Medication Sig Start Date End Date Taking? Authorizing Provider  aspirin-sod bicarb-citric acid (ALKA-SELTZER) 325 MG TBEF tablet Take 325 mg by mouth every 6 (six) hours as needed (sinus).    [provider]  dapagliflozin propanediol (FARXIGA) 10 MG TABS tablet Take 1 tablet by mouth daily. 04/18/23   [provider]  HYDROcodone -acetaminophen  (NORCO/VICODIN) 5-325 MG tablet Take 1-2 tablets by mouth every 6 (six) hours as needed. 03/09/24   Jerrol Agent, MD  ibuprofen (ADVIL,MOTRIN) 200 MG tablet Take 400 mg by mouth daily as needed for pain.    [provider]  ketoconazole  (NIZORAL ) 2 % cream Apply topically 2 (two) times daily. Patient not taking: Reported on 11/14/2014 06/10/13   Mario Million, MD  naproxen sodium (ANAPROX) 220 MG tablet Take 220 mg by mouth 2 (two) times daily with a meal.    [provider]  ondansetron  (ZOFRAN ) 4 MG  tablet Take 1 tablet (4 mg total) by mouth every 6 (six) hours. 11/14/14   Nasario Moats, PA-C  oxyCODONE -acetaminophen  (PERCOCET/ROXICET) 5-325 MG per tablet Take 1-2 tablets by mouth every 6 (six) hours as needed for severe pain. 11/14/14   Nasario Moats, PA-C  oxymetazoline (AFRIN) 0.05 % nasal spray Place 3 sprays into the nose 3 (three) times daily.    [provider]  predniSONE  (DELTASONE ) 20 MG tablet 3-2-2-1-1-1 daily with food for shoulder tendonitis and rotator cuff strain Patient not taking: Reported on 11/14/2014 06/10/13   Mario Million, MD  Semaglutide,0.25 or 0.5MG /DOS, (OZEMPIC, 0.25 OR 0.5 MG/DOSE,) 2 MG/3ML SOPN Inject 0.5 mg into the skin once a week. 03/31/23   [provider]  simvastatin (ZOCOR) 20 MG tablet Take 20 mg by mouth every evening. 03/29/23 06/27/23  [provider]  tamsulosin  (FLOMAX ) 0.4 MG CAPS capsule Take 1 capsule (0.4 mg total) by mouth daily. 11/14/14   Nasario Moats, PA-C    Allergies: Codeine    Review of Systems  Updated Vital Signs BP (!) 131/94 (BP Location: Right Arm)   Pulse 66   Temp 98.6 F (37 C)   Resp 17   SpO2 100%   Physical Exam Vitals and nursing note reviewed.  Constitutional:      General: She is not in acute distress.    Appearance: She is well-developed.  HENT:     Head: Normocephalic and atraumatic.     Right Ear: External  ear normal.     Left Ear: External ear normal.     Nose: Nose normal.  Eyes:     Conjunctiva/sclera: Conjunctivae normal.  Cardiovascular:     Rate and Rhythm: Normal rate and regular rhythm.     Heart sounds: No murmur heard. Pulmonary:     Effort: No respiratory distress.     Breath sounds: No wheezing, rhonchi or rales.  Abdominal:     Palpations: Abdomen is soft.     Tenderness: There is no abdominal tenderness. There is right CVA tenderness (Minimal). There is no left CVA tenderness, guarding or rebound.  Musculoskeletal:     Cervical back: Normal range of  motion and neck supple.     Right lower leg: No edema.     Left lower leg: No edema.  Skin:    General: Skin is warm and dry.     Findings: No rash.  Neurological:     General: No focal deficit present.     Mental Status: She is alert. Mental status is at baseline.     Motor: No weakness.  Psychiatric:        Mood and Affect: Mood normal.     (all labs ordered are listed, but only abnormal results are displayed) Labs Reviewed  URINALYSIS, ROUTINE W REFLEX MICROSCOPIC - Abnormal; Notable for the following components:      Result Value   Glucose, UA >1,000 (*)    Hgb urine dipstick LARGE (*)    Protein, ur TRACE (*)    All other components within normal limits  BASIC METABOLIC PANEL WITH GFR - Abnormal; Notable for the following components:   Glucose, Bld 103 (*)    Calcium 10.6 (*)    All other components within normal limits  CBC WITH DIFFERENTIAL/PLATELET    EKG: None  Radiology: No results found.   Procedures   Medications Ordered in the ED - No data to display  ED Course  Patient seen and examined. History obtained directly from patient.   Labs/EKG: Ordered CBC, BMP, UA  Imaging: Ordered CT renal protocol  Medications/Fluids: None ordered  Most recent vital signs reviewed and are as follows: BP (!) 131/94 (BP Location: Right Arm)   Pulse 66   Temp 98.6 F (37 C)   Resp 17   SpO2 100%   Initial impression: Right-sided flank pain, history of kidney stone.  This is likely kidney stone, however symptoms are not but improving with routine care.  Today is day 6 of symptoms.  Will plan CT renal protocol for further evaluation.  10:59 AM Reassessment performed. Patient appears comfortable.  Labs personally reviewed and interpreted including: CBC with normal white blood cell count and hemoglobin; BMP with normal creatinine; UA with blood but not compelling for infection.  Imaging personally visualized and interpreted including: CT of the abdomen and pelvis,  renal protocol, shows 6 mm right sided proximal stone with hydronephrosis.  Reviewed pertinent lab work and imaging with patient at bedside. Questions answered.   Most current vital signs reviewed and are as follows: BP (!) 131/94 (BP Location: Right Arm)   Pulse 66   Temp 98.6 F (37 C)   Resp 17   SpO2 100%   Plan: Discharge to home.    Patient has been taking ibuprofen with minimal improvement.  She tells me that she has been prescribed oral ketorolac  in the past.  Patient does not have a history of peptic ulcer disease or other GI bleeding.  She  does not have a history of renal dysfunction.  She does not have known coronary artery disease or other cardiovascular history.  Given that ibuprofen has been minimally helpful, will prescribe # 10 tablets of ketorolac  10 mg to take every 6 hours as needed.  Will also prescribe # 6 tablets of oxycodone .  We discussed risks of GI bleed, renal dysfunction, ulcer disease/perforation.  Patient counseled on kidney stone treatment. Urged patient to strain urine and save any stones. Urged urology follow-up and return to [facility] with any complications. Counseled patient to maintain good fluid intake.   Patient states that she has an appointment with her own urologist, in [place], [place] , in February.  I have asked her to call her urologist and discuss results today as this could change follow-up recommendations.  As a backup, she is given referral for [facility].  Patient counseled on use of narcotic pain medications. Counseled not to combine these medications with others containing tylenol . Urged not to drink alcohol, drive, or perform any other activities that requires focus while taking these medications. The patient verbalizes understanding and agrees with the plan.                                   Medical Decision Making Amount and/or Complexity of Data Reviewed Labs: ordered. Radiology: ordered.  Risk Prescription drug  management.   For this patient's complaint of abdominal pain, the following conditions were considered on the differential diagnosis: gastritis/PUD, enteritis/duodenitis, appendicitis, cholelithiasis/cholecystitis, cholangitis, pancreatitis, ruptured viscus, colitis, diverticulitis, small/large bowel obstruction, proctitis, cystitis, pyelonephritis, ureteral colic, aortic dissection, aortic aneurysm. In women, ectopic pregnancy, pelvic inflammatory disease, ovarian cysts, and tubo-ovarian abscess were also considered. Atypical chest etiologies were also considered including ACS, PE, and pneumonia.  Patient history, and exam consistent with ureteral colic which is evident on CT renal protocol.  Patient has had pain for 5/6 days, stone is still fairly proximal.  She will likely need outpatient follow-up.  Discussed follow-up with her urologist or a referral.  Pain control given.  Patient has Zofran  at home.  She is on chronic tamsulosin .  No indications for emergent urology consultation at this time as pain is controlled.  Normal kidney function, no UTI or pyelo.   The patient's vital signs, pertinent lab work and imaging were reviewed and interpreted as discussed in the ED course. Hospitalization was considered for further testing, treatments, or serial exams/observation. However as patient is well-appearing, has a stable exam, and reassuring studies today, I do not feel that they warrant admission at this time. This plan was discussed with the patient who verbalizes agreement and comfort with this plan and seems reliable and able to return to the Emergency Department with worsening or changing symptoms.        Final diagnoses:  Ureteral colic    ED Discharge Orders          Ordered    oxyCODONE  (OXY IR/ROXICODONE ) 5 MG immediate release tablet  Every 6 hours PRN        09/11/24 1055    ketorolac  (TORADOL ) 10 MG tablet  Every 6 hours PRN        09/11/24 1055               Desiderio Chew, PA-C 09/11/24 1103    Doretha Folks, MD 09/11/24 1421

## 2024-09-11 NOTE — Discharge Instructions (Addendum)
 Please read and follow all provided instructions.  Your diagnoses today include:  1. Ureteral colic     Tests performed today include: Urine test that showed blood in your urine and no infection CT scan which showed a 6 millimeter obstructing stone on the right side near the UPJ (ureteropelvic junction). You also have other stones in both kidneys which are not currently causing a problem. Blood test that showed normal kidney function Vital signs. See below for your results today.   Medications prescribed:  Oxycodone  - narcotic pain medication  DO NOT drive or perform any activities that require you to be awake and alert because this medicine can make you drowsy.   Ketorolac  - this is an oral NSAID, do not combine with ibuprofen, aspirin, naproxen, or other NSAID medications.  Discontinue if you have abdominal pain or blood in the stool.  Take any prescribed medications only as directed.  Home care instructions:  Follow any educational materials contained in this packet.  Please double your fluid intake for the next several days. Strain your urine and save any stones that may pass.   BE VERY CAREFUL not to take multiple medicines containing Tylenol  (also called acetaminophen ). Doing so can lead to an overdose which can damage your liver and cause liver failure and possibly death.   Follow-up instructions: Please follow-up with your urologist or the urologist referral (provided on front page) in the next 1 week for further evaluation of your symptoms.  Return instructions:  If you need to return to the Emergency Department, go to [facility]. The urologists are located at [facility] and can better care for you at this location, if they need to be involved.   Please return to the Emergency Department if you experience worsening symptoms.  Please return if you develop fever or uncontrolled pain or vomiting. Please return if you have any other emergent concerns.  Additional  Information:  Your vital signs today were: BP (!) 131/94 (BP Location: Right Arm)   Pulse 66   Temp 98.6 F (37 C)   Resp 17   SpO2 100%  If your blood pressure (BP) was elevated above 135/85 this visit, please have this repeated by your doctor within one month. --------------

## 2024-09-12 ENCOUNTER — Encounter (HOSPITAL_COMMUNITY): Payer: Self-pay

## 2024-09-12 ENCOUNTER — Emergency Department (HOSPITAL_COMMUNITY)
Admission: EM | Admit: 2024-09-12 | Discharge: 2024-09-12 | Disposition: A | Discharge status: Home / Self Care | Source: Home / Self Care | Attending: Emergency Medicine | Admitting: Emergency Medicine

## 2024-09-12 ENCOUNTER — Emergency Department (HOSPITAL_COMMUNITY)

## 2024-09-12 DIAGNOSIS — R10A1 Flank pain, right side: Secondary | ICD-10-CM | POA: Diagnosis present

## 2024-09-12 DIAGNOSIS — Z79899 Other long term (current) drug therapy: Secondary | ICD-10-CM | POA: Insufficient documentation

## 2024-09-12 DIAGNOSIS — N202 Calculus of kidney with calculus of ureter: Secondary | ICD-10-CM | POA: Insufficient documentation

## 2024-09-12 DIAGNOSIS — N200 Calculus of kidney: Secondary | ICD-10-CM

## 2024-09-12 LAB — URINALYSIS, ROUTINE W REFLEX MICROSCOPIC
Bacteria, UA: NONE SEEN
Bilirubin Urine: NEGATIVE
Glucose, UA: >=500 mg/dL — AB
Ketones, ur: NEGATIVE mg/dL
Nitrite: NEGATIVE
Protein, ur: NEGATIVE mg/dL
RBC / HPF: >50 RBC/hpf (ref 0–5)
Specific Gravity, Urine: 1.021 (ref 1.005–1.030)
pH: 5 (ref 5.0–8.0)

## 2024-09-12 LAB — COMPREHENSIVE METABOLIC PANEL WITH GFR
ALT: 20 U/L (ref 0–44)
AST: 18 U/L (ref 15–41)
Albumin: 4.3 g/dL (ref 3.5–5.0)
Alkaline Phosphatase: 136 U/L — ABNORMAL HIGH (ref 38–126)
Anion gap: 11 (ref 5–15)
BUN: 10 mg/dL (ref 6–20)
CO2: 23 mmol/L (ref 22–32)
Calcium: 10.6 mg/dL — ABNORMAL HIGH (ref 8.9–10.3)
Chloride: 106 mmol/L (ref 98–111)
Creatinine, Ser: 0.66 mg/dL (ref 0.44–1.00)
GFR, Estimated: >60 mL/min (ref >60)
Glucose, Bld: 127 mg/dL — ABNORMAL HIGH (ref 70–99)
Potassium: 3.9 mmol/L (ref 3.5–5.1)
Sodium: 139 mmol/L (ref 135–145)
Total Bilirubin: 0.5 mg/dL (ref 0.0–1.2)
Total Protein: 7.3 g/dL (ref 6.5–8.1)

## 2024-09-12 LAB — CBC
HCT: 43.4 % (ref 36.0–46.0)
Hemoglobin: 14.1 g/dL (ref 12.0–15.0)
MCH: 27.6 pg (ref 26.0–34.0)
MCHC: 32.5 g/dL (ref 30.0–36.0)
MCV: 85.1 fL (ref 80.0–100.0)
Platelets: 282 K/uL (ref 150–400)
RBC: 5.1 MIL/uL (ref 3.87–5.11)
RDW: 13.4 % (ref 11.5–15.5)
WBC: 5.9 K/uL (ref 4.0–10.5)
nRBC: 0 % (ref 0.0–0.2)

## 2024-09-12 MED ORDER — ONDANSETRON HCL 4 MG/2ML IJ SOLN
4.0000 mg | Freq: Once | INTRAMUSCULAR | Status: AC
  Administered 2024-09-12: 03:00:00 4 mg via INTRAVENOUS
  Filled 2024-09-12: qty 2

## 2024-09-12 MED ORDER — KETOROLAC TROMETHAMINE 15 MG/ML IJ SOLN
15.0000 mg | Freq: Once | INTRAMUSCULAR | Status: AC
  Administered 2024-09-12: 03:00:00 15 mg via INTRAVENOUS
  Filled 2024-09-12: qty 1

## 2024-09-12 MED ADMIN — Oxycodone HCl Tab 5 MG: 5 mg | ORAL | @ 04:00:00 | NDC 00406055223

## 2024-09-12 MED FILL — Oxycodone HCl Tab 5 MG: 5.0000 mg | ORAL | Qty: 1 | Status: AC

## 2024-09-12 NOTE — Discharge Instructions (Signed)
 Seen today for ongoing kidney stone pain.  It is very important that you take medications for pain at home as prescribed.  Make sure that you are staying hydrated.  If you develop fevers, worsening pain despite pain medication or any new or worsening symptoms, you should be reevaluated.

## 2024-09-12 NOTE — ED Triage Notes (Signed)
 Pt was seen at [facility] ED yesterday and diagnosed with right sided obstructing kidney stone. She is here tonight because the pain has worsened.

## 2024-09-12 NOTE — ED Provider Notes (Signed)
 [place] EMERGENCY DEPARTMENT AT [facility] Provider Note   CSN: [id number] Arrival date & time: 09/12/24  9762     Patient presents with: Flank Pain   Amanda Kirk is a 53 y.o. female.   HPI    This is a 53 year old female who presents with right flank pain.  Was seen and evaluated 24 hours ago for the same and noted to have a right sided kidney stone.  It was 6 mm.  She has a history of kidney stones.  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  She has taken 2 doses of ibuprofen but states ongoing discomfort.  Reports nausea without vomiting.  No fevers.  Prior to Admission medications  Medication Sig Start Date End Date Taking? Authorizing Provider  amLODipine (NORVASC) 5 MG tablet Take 5 mg by mouth daily. 02/15/22  Yes [provider]  celecoxib (CELEBREX) 100 MG capsule Take 100 mg by mouth daily as needed. 07/01/24  Yes [provider]  cholecalciferol (VITAMIN D3) 25 MCG (1000 UNIT) tablet Take 1,000 Units by mouth daily.   Yes [provider]  ezetimibe (ZETIA) 10 MG tablet Take 10 mg by mouth daily.   Yes [provider]  hydrochlorothiazide (HYDRODIURIL) 25 MG tablet Take 25 mg by mouth daily. 12/19/23  Yes [provider]  Semaglutide,0.25 or 0.5MG /DOS, (OZEMPIC, 0.25 OR 0.5 MG/DOSE,) 2 MG/3ML SOPN Inject 0.5 mg into the skin once a week. 03/31/23  Yes [provider]  tamsulosin  (FLOMAX ) 0.4 MG CAPS capsule Take 1 capsule (0.4 mg total) by mouth daily. 11/14/14  Yes Nasario Moats, PA-C  tiZANidine (ZANAFLEX) 4 MG tablet Take 4 mg by mouth every 6 (six) hours as needed for muscle spasms. 01/08/24  Yes [provider]  traMADol (ULTRAM) 50 MG tablet Take 50 mg by mouth every 6 (six) hours as needed. 11/23/16  Yes [provider]  traZODone (DESYREL) 50 MG tablet Take 1 tablet by mouth at bedtime as needed. 01/23/23  Yes [provider]  ketoconazole  (NIZORAL ) 2 % cream Apply topically 2 (two) times daily. Patient not taking: Reported on 11/14/2014 06/10/13   Mario Million, MD  ketorolac  (TORADOL ) 10 MG tablet Take 1 tablet (10 mg total) by mouth every 6 (six) hours as needed (Do not combine with ibuprofen or other NSAIDs). 09/11/24   Desiderio Chew, PA-C  ondansetron  (ZOFRAN ) 4 MG tablet Take 1 tablet (4 mg total) by mouth every 6 (six) hours. Patient not taking: Reported on 09/12/2024 11/14/14   Nasario Moats, PA-C  oxyCODONE  (OXY IR/ROXICODONE ) 5 MG immediate release tablet Take 1 tablet (5 mg total) by mouth every 6 (six) hours as needed for severe pain (pain score 7-10). 09/11/24   Desiderio Chew, PA-C  predniSONE  (DELTASONE ) 20 MG tablet 3-2-2-1-1-1 daily with food for shoulder tendonitis and rotator cuff strain Patient not taking: Reported on 11/14/2014 06/10/13   Mario Million, MD    Allergies: Codeine    Review of Systems  Constitutional:  Negative for fever.  Gastrointestinal:  Negative for abdominal pain, nausea and vomiting.  Genitourinary:  Positive for flank pain.  All other systems reviewed and are negative.   Updated Vital Signs BP 132/89 (BP Location: Left Arm)   Pulse 72   Temp 98.1 F (36.7 C) (Oral)   Resp 16   SpO2 99%   Physical Exam Vitals and nursing note reviewed.  Constitutional:  Appearance: She is well-developed. She is not ill-appearing.  HENT:     Head: Normocephalic and atraumatic.  Eyes:     Pupils: Pupils are equal, round, and reactive to light.  Cardiovascular:     Rate and Rhythm: Normal rate and regular rhythm.     Heart sounds: Normal heart sounds.  Pulmonary:     Effort: Pulmonary effort is normal. No respiratory distress.     Breath sounds: No wheezing.  Abdominal:     Palpations: Abdomen is soft.     Tenderness: There is right CVA tenderness. There is no left CVA tenderness.  Musculoskeletal:     Cervical back: Neck supple.  Skin:     General: Skin is warm and dry.  Neurological:     Mental Status: She is alert and oriented to person, place, and time.  Psychiatric:        Mood and Affect: Mood normal.     (all labs ordered are listed, but only abnormal results are displayed) Labs Reviewed  COMPREHENSIVE METABOLIC PANEL WITH GFR - Abnormal; Notable for the following components:      Result Value   Glucose, Bld 127 (*)    Calcium 10.6 (*)    Alkaline Phosphatase 136 (*)    All other components within normal limits  URINALYSIS, ROUTINE W REFLEX MICROSCOPIC - Abnormal; Notable for the following components:   Glucose, UA >=500 (*)    Hgb urine dipstick LARGE (*)    Leukocytes,Ua TRACE (*)    All other components within normal limits  CBC    EKG: None  Radiology: DG Abdomen 1 View Result Date: 09/12/2024 EXAM: 1 VIEW XRAY OF THE ABDOMEN 09/12/2024 04:05:00 AM COMPARISON: CT of the abdomen and pelvis dated 09/11/2024. CLINICAL HISTORY: stone FINDINGS: BOWEL: Nonobstructive bowel gas pattern. SOFT TISSUES: There is a round calculus within the right mid abdomen measuring approximately 5 mm in diameter, which likely corresponds with the calculus previously seen at the right ureteropelvic junction. It does not appear to move significantly in the interim. There is also a 6 mm calculus projecting over the lower pole of the right kidney. A 6 mm calculus also projects over the lower pole of the left kidney. There are small round calculi also present within the pelvis bilaterally, compatible with phleboliths, as seen on the previous study. BONES: No acute fracture. IMPRESSION: 1. 5 mm calculus in the right mid abdomen, likely corresponding with the previously seen right ureteropelvic junction calculus, without significant interval movement. 2. 6 mm calculus projecting over the lower pole of the right kidney. 3. 6 mm calculus projecting over the lower pole of the left kidney. 4. Small round calculi within the pelvis bilaterally,  compatible with phleboliths, as seen on the previous study. Electronically signed by: Evalene Coho MD 09/12/2024 04:49 AM EST RP Workstation: [id number]   CT Renal Stone Study Result Date: 09/11/2024 EXAM: CT ABDOMEN AND PELVIS WITHOUT CONTRAST 09/11/2024 10:01:54 AM TECHNIQUE: CT of the abdomen and pelvis was performed without the administration of intravenous contrast. Multiplanar reformatted images are provided for review. Automated exposure control, iterative reconstruction, and/or weight-based adjustment of the mA/kV was utilized to reduce the radiation dose to as low as reasonably achievable. COMPARISON: None available. CLINICAL HISTORY: Abdominal/flank pain, stone suspected; right flank pain, not improving after 5 days. FINDINGS: LOWER CHEST: No acute abnormality. LIVER: The liver is unremarkable. GALLBLADDER AND BILE DUCTS: Cholecystectomy. No biliary ductal dilatation. SPLEEN: No acute abnormality. PANCREAS: No acute abnormality. ADRENAL GLANDS: No acute abnormality.  KIDNEYS, URETERS AND BLADDER: Right kidney: Single 5 mm calculus in the lower pole. Mild hydronephrosis. Obstructing calculus in the right ureter at the ureteropelvic junction measuring 6 mm on image 36 of series 2. Left kidney: 4 calculi measuring in size from 2 to 6 mm. No bladder calculi. Urinary bladder is unremarkable. No perinephric or periureteral stranding. GI AND BOWEL: Stomach demonstrates no acute abnormality. There is no bowel obstruction. PERITONEUM AND RETROPERITONEUM: No ascites. No free air. VASCULATURE: Aorta is normal in caliber. LYMPH NODES: No lymphadenopathy. REPRODUCTIVE ORGANS: No acute abnormality. BONES AND SOFT TISSUES: No acute osseous abnormality. No focal soft tissue abnormality. IMPRESSION: 1. Obstructing 6 mm calculus at the right ureteropelvic junction with mild right hydronephrosis. 2. Additional bilateral nonobstructing renal calculi. Electronically signed by: Norleen Boxer MD 09/11/2024 10:32 AM EST RP  Workstation: [id number]     Procedures   Medications Ordered in the ED  ketorolac  (TORADOL ) 15 MG/ML injection 15 mg (15 mg Intravenous Given 09/12/24 0329)  oxyCODONE  (Oxy IR/ROXICODONE ) immediate release tablet 5 mg (5 mg Oral Given 09/12/24 0331)  ondansetron  (ZOFRAN ) injection 4 mg (4 mg Intravenous Given 09/12/24 0327)                                    Medical Decision Making Amount and/or Complexity of Data Reviewed Radiology: ordered.  Risk Prescription drug management.   This patient presents to the ED for concern of flank pain, this involves an extensive number of treatment options, and is a complaint that carries with it a high risk of complications and morbidity.  I considered the following differential and admission for this acute, potentially life threatening condition.  The differential diagnosis includes ongoing renal colic, pyelo  MDM:    This is a 53 year old female with recent diagnosis of kidney stone who presents with persistent flank pain.  She has not been taking her outpatient pain medications because she wanted to go to work.  She only has taken ibuprofen.  She reports some improvement of symptoms after receiving Toradol  and oxycodone  in triage.  No fevers.  Urinalysis not consistent with UTI.  Kidney function is preserved.  KUB shows likely no change in position of stone.  On recheck, she reports significant improvement of symptoms.  Discussed with her that she will need to attempt pain management at home with prescribed medications and follow-up with urology.  No indication for urology intervention at this time.  (Labs, imaging, consults)  Labs: I Ordered, and personally interpreted labs.  The pertinent results include: CBC, BMP, urinalysis  Imaging Studies ordered: I ordered imaging studies including KUB I independently visualized and interpreted imaging. I agree with the radiologist interpretation  Additional history obtained from chart review.   External records from outside source obtained and reviewed including CT imaging  Cardiac Monitoring: The patient was maintained on a cardiac monitor.  If on the cardiac monitor, I personally viewed and interpreted the cardiac monitored which showed an underlying rhythm of: Sinus  Reevaluation: After the interventions noted above, I reevaluated the patient and found that they have :improved  Social Determinants of Health:  lives independently  Disposition: Discharge  Co morbidities that complicate the patient evaluation  Past Medical History:  Diagnosis Date   Calculus of gallbladder with acute cholecystitis, without mention of obstruction    Calculus of kidney    Kidney stones    Other chronic nonalcoholic liver disease  Medicines Meds ordered this encounter  Medications   ketorolac  (TORADOL ) 15 MG/ML injection 15 mg   oxyCODONE  (Oxy IR/ROXICODONE ) immediate release tablet 5 mg    Refill:  0   ondansetron  (ZOFRAN ) injection 4 mg    I have reviewed the patients home medicines and have made adjustments as needed  Problem List / ED Course: Problem List Items Addressed This Visit   None Visit Diagnoses       Kidney stone    -  Primary   Relevant Medications   oxyCODONE  (Oxy IR/ROXICODONE ) immediate release tablet 5 mg (Completed)   hydrochlorothiazide (HYDRODIURIL) 25 MG tablet   traMADol (ULTRAM) 50 MG tablet                Final diagnoses:  Kidney stone    ED Discharge Orders     None          Bari Charmaine FALCON, MD 09/12/24 [phone]

## 2024-09-23 ENCOUNTER — Other Ambulatory Visit: Payer: Self-pay | Admitting: Urology

## 2024-09-27 NOTE — Progress Notes (Addendum)
 Date of COVID positive in last 90 days:  PCP - Redell Kuster, PA Cardiologist - n/a  Chest x-ray - 05/30/24 Epic EKG - 06/03/24 Epic Stress Test - N/A ECHO - N/A Cardiac Cath - N/A Pacemaker/ICD device last checked:N/A Spinal Cord Stimulator:N/A  Bowel Prep - N/A  Sleep Study - N/A CPAP -   Fasting Blood Sugar - 119-125 Checks Blood Sugar 2 times a week  Last dose of GLP1 agonist-  Ozempic, take on Sundays GLP1 instructions:  Do not take after  09/24/24   Last dose of SGLT-2 inhibitors-  N/A SGLT-2 instructions:  Do not take after     Blood Thinner Instructions: N/A Last dose:   Time: Aspirin Instructions:N/A Last Dose:  Activity level: Can go up a flight of stairs and perform activities of daily living without stopping and without symptoms of chest pain or shortness of breath.  Anesthesia review: N/A  Patient denies shortness of breath, fever, cough and chest pain at PAT appointment  Patient verbalized understanding of instructions that were given to them at the PAT appointment. Patient was also instructed that they will need to review over the PAT instructions again at home before surgery.

## 2024-09-27 NOTE — Patient Instructions (Addendum)
 SURGICAL WAITING ROOM VISITATION  Patients having surgery or a procedure may have no more than 2 support people in the waiting area - these visitors may rotate.    Children ages 24 and under will not be able to visit patients in [facility] under most circumstances.   Visitors with respiratory illnesses are discouraged from visiting and should remain at home.  If the patient needs to stay at the hospital during part of their recovery, the visitor guidelines for inpatient rooms apply. Pre-op nurse will coordinate an appropriate time for 1 support person to accompany patient in pre-op.  This support person may not rotate.    Please refer to the [facility] website for the visitor guidelines for Inpatients (after your surgery is over and you are in a regular room).    Your procedure is scheduled on: 10/02/24   Report to [facility] Main Entrance    Report to admitting at 10:00 AM   Call this number if you have problems the morning of surgery [phone]   Do not eat food or drink liquids :After Midnight.          If you have questions, please contact your surgeons office.   FOLLOW BOWEL PREP AND ANY ADDITIONAL PRE OP INSTRUCTIONS YOU RECEIVED FROM YOUR SURGEON'S OFFICE!!!     Oral Hygiene is also important to reduce your risk of infection.                                    Remember - BRUSH YOUR TEETH THE MORNING OF SURGERY WITH YOUR REGULAR TOOTHPASTE  DENTURES WILL BE REMOVED PRIOR TO SURGERY PLEASE DO NOT APPLY Poly grip OR ADHESIVES!!!   Stop all vitamins and herbal supplements 7 days before surgery.   Take these medicines the morning of surgery with A SIP OF WATER: Amlodipine, Zetia, Tamsulosin , Tramadol   DO NOT TAKE ANY ORAL DIABETIC MEDICATIONS DAY OF YOUR SURGERY  How to Manage Your Diabetes Before and After Surgery  Why is it important to control my blood sugar before and after surgery? Improving blood sugar levels before and after surgery helps  healing and can limit problems. A way of improving blood sugar control is eating a healthy diet by:  Eating less sugar and carbohydrates  Increasing activity/exercise  Talking with your doctor about reaching your blood sugar goals High blood sugars (greater than 180 mg/dL) can raise your risk of infections and slow your recovery, so you will need to focus on controlling your diabetes during the weeks before surgery. Make sure that the doctor who takes care of your diabetes knows about your planned surgery including the date and location.  How do I manage my blood sugar before surgery? Check your blood sugar at least 4 times a day, starting 2 days before surgery, to make sure that the level is not too high or low. Check your blood sugar the morning of your surgery when you wake up and every 2 hours until you get to the Short Stay unit. If your blood sugar is less than 70 mg/dL, you will need to treat for low blood sugar: Do not take insulin . Treat a low blood sugar (less than 70 mg/dL) with  cup of clear juice (cranberry or apple), 4 glucose tablets, OR glucose gel. Recheck blood sugar in 15 minutes after treatment (to make sure it is greater than 70 mg/dL). If your blood sugar  is not greater than 70 mg/dL on recheck, call [phone] for further instructions. Report your blood sugar to the short stay nurse when you get to Short Stay.  If you are admitted to the hospital after surgery: Your blood sugar will be checked by the staff and you will probably be given insulin  after surgery (instead of oral diabetes medicines) to make sure you have good blood sugar levels. The goal for blood sugar control after surgery is 80-180 mg/dL.   WHAT DO I DO ABOUT MY DIABETES MEDICATION?  Do not take oral diabetes medicines (pills) the morning of surgery.  Do not take Ozempic after 09/24/24.  DO NOT TAKE THE FOLLOWING 7 DAYS PRIOR TO SURGERY: Ozempic, Wegovy, Rybelsus (Semaglutide), Byetta (exenatide),  Bydureon (exenatide ER), Victoza, Saxenda (liraglutide), or Trulicity (dulaglutide) Mounjaro (Tirzepatide) Adlyxin (Lixisenatide), Polyethylene Glycol Loxenatide.   Reviewed and Endorsed by [facility] Patient Education Committee, August 2015                              You may not have any metal on your body including hair pins, jewelry, and body piercing             Do not wear make-up, lotions, powders, perfumes, or deodorant  Do not wear nail polish including gel and S&S, artificial/acrylic nails, or any other type of covering on natural nails including finger and toenails. If you have artificial nails, gel coating, etc. that needs to be removed by a nail salon please have this removed prior to surgery or surgery may need to be canceled/ delayed if the surgeon/ anesthesia feels like they are unable to be safely monitored.   Do not shave  48 hours prior to surgery.    Do not bring valuables to the hospital. [facility] IS NOT             RESPONSIBLE   FOR VALUABLES.   Contacts, glasses, dentures or bridgework may not be worn into surgery.  DO NOT BRING YOUR HOME MEDICATIONS TO THE HOSPITAL. PHARMACY WILL DISPENSE MEDICATIONS LISTED ON YOUR MEDICATION LIST TO YOU DURING YOUR ADMISSION IN THE HOSPITAL!    Patients discharged on the day of surgery will not be allowed to drive home.  Someone NEEDS to stay with you for the first 24 hours after anesthesia.              Please read over the following fact sheets you were given: IF YOU HAVE QUESTIONS ABOUT YOUR PRE-OP INSTRUCTIONS PLEASE CALL [phone] [place].   If you received a COVID test during your pre-op visit  it is requested that you wear a mask when out in public, stay away from anyone that may not be feeling well and notify your surgeon if you develop symptoms. If you test positive for Covid or have been in contact with anyone that has tested positive in the last 10 days please notify you surgeon.     [facility] - Preparing for  Surgery Before surgery, you can play an important role.  Because skin is not sterile, your skin needs to be as free of germs as possible.  You can reduce the number of germs on your skin by washing with CHG (chlorahexidine gluconate) soap before surgery.  CHG is an antiseptic cleaner which kills germs and bonds with the skin to continue killing germs even after washing. Please DO NOT use if you have an allergy to CHG or antibacterial  soaps.  If your skin becomes reddened/irritated stop using the CHG and inform your nurse when you arrive at Short Stay. Do not shave (including legs and underarms) for at least 48 hours prior to the first CHG shower.  You may shave your face/neck.  Please follow these instructions carefully:  1.  Shower with CHG Soap the night before surgery and the morning of surgery.  2.  If you choose to wash your hair, wash your hair first as usual with your normal  shampoo.  3.  After you shampoo, rinse your hair and body thoroughly to remove the shampoo.                             4.  Use CHG as you would any other liquid soap.  You can apply chg directly to the skin and wash.  Gently with a scrungie or clean washcloth.  5.  Apply the CHG Soap to your body ONLY FROM THE NECK DOWN.   Do   not use on face/ open                           Wound or open sores. Avoid contact with eyes, ears mouth and   genitals (private parts).                       Wash face,  Genitals (private parts) with your normal soap.             6.  Wash thoroughly, paying special attention to the area where your    surgery  will be performed.  7.  Thoroughly rinse your body with warm water from the neck down.  8.  DO NOT shower/wash with your normal soap after using and rinsing off the CHG Soap.                9.  Pat yourself dry with a clean towel.            10.  Wear clean pajamas.            11.  Place clean sheets on your bed the night of your first shower and do not  sleep with pets. Day of Surgery  : Do not apply any CHG, lotions/deodorants the morning of surgery.  Please wear clean clothes to the hospital/surgery center.  FAILURE TO FOLLOW THESE INSTRUCTIONS MAY RESULT IN THE CANCELLATION OF YOUR SURGERY  PATIENT SIGNATURE_________________________________  NURSE SIGNATURE__________________________________  ________________________________________________________________________

## 2024-10-01 ENCOUNTER — Encounter (HOSPITAL_COMMUNITY)
Admission: RE | Admit: 2024-10-01 | Discharge: 2024-10-01 | Disposition: A | Discharge status: Home / Self Care | Source: Ambulatory Visit | Attending: Urology | Admitting: Urology

## 2024-10-01 ENCOUNTER — Encounter (HOSPITAL_COMMUNITY): Payer: Self-pay

## 2024-10-01 ENCOUNTER — Other Ambulatory Visit: Payer: Self-pay

## 2024-10-01 VITALS — BP 120/87 | HR 82 | Temp 98.2°F | Resp 16 | Ht 64.0 in | Wt 152.0 lb

## 2024-10-01 DIAGNOSIS — Z01818 Encounter for other preprocedural examination: Secondary | ICD-10-CM | POA: Diagnosis present

## 2024-10-01 DIAGNOSIS — E119 Type 2 diabetes mellitus without complications: Secondary | ICD-10-CM | POA: Diagnosis not present

## 2024-10-01 HISTORY — DX: Essential (primary) hypertension: I10

## 2024-10-01 HISTORY — DX: Type 2 diabetes mellitus without complications: E11.9

## 2024-10-01 HISTORY — DX: Personal history of urinary calculi: Z87.442

## 2024-10-01 HISTORY — DX: Unspecified osteoarthritis, unspecified site: M19.90

## 2024-10-01 LAB — HEMOGLOBIN A1C
Hgb A1c MFr Bld: 6.5 % — ABNORMAL HIGH (ref 4.8–5.6)
Mean Plasma Glucose: 139.85 mg/dL

## 2024-10-01 LAB — GLUCOSE, CAPILLARY: Glucose-Capillary: 96 mg/dL (ref 70–99)

## 2024-10-02 ENCOUNTER — Encounter (HOSPITAL_COMMUNITY): Payer: Self-pay | Admitting: Urology

## 2024-10-02 ENCOUNTER — Ambulatory Visit (HOSPITAL_COMMUNITY)
Admission: RE | Admit: 2024-10-02 | Discharge: 2024-10-02 | Disposition: A | Discharge status: Home / Self Care | Attending: Urology | Admitting: Urology

## 2024-10-02 ENCOUNTER — Ambulatory Visit (HOSPITAL_COMMUNITY)

## 2024-10-02 ENCOUNTER — Other Ambulatory Visit: Payer: Self-pay

## 2024-10-02 ENCOUNTER — Encounter (HOSPITAL_COMMUNITY)
Admission: RE | Disposition: A | Discharge status: Home / Self Care | Payer: Self-pay | Source: Home / Self Care | Attending: Urology

## 2024-10-02 ENCOUNTER — Ambulatory Visit (HOSPITAL_COMMUNITY): Payer: Self-pay | Admitting: Anesthesiology

## 2024-10-02 ENCOUNTER — Encounter (HOSPITAL_COMMUNITY): Payer: Self-pay | Admitting: Medical

## 2024-10-02 DIAGNOSIS — I10 Essential (primary) hypertension: Secondary | ICD-10-CM | POA: Diagnosis not present

## 2024-10-02 DIAGNOSIS — E119 Type 2 diabetes mellitus without complications: Secondary | ICD-10-CM | POA: Insufficient documentation

## 2024-10-02 DIAGNOSIS — N202 Calculus of kidney with calculus of ureter: Secondary | ICD-10-CM

## 2024-10-02 DIAGNOSIS — Z79899 Other long term (current) drug therapy: Secondary | ICD-10-CM | POA: Diagnosis not present

## 2024-10-02 DIAGNOSIS — N200 Calculus of kidney: Secondary | ICD-10-CM | POA: Diagnosis present

## 2024-10-02 DIAGNOSIS — Z7984 Long term (current) use of oral hypoglycemic drugs: Secondary | ICD-10-CM | POA: Diagnosis not present

## 2024-10-02 HISTORY — PX: CYSTOSCOPY/URETEROSCOPY/HOLMIUM LASER/STENT PLACEMENT: SHX6546

## 2024-10-02 LAB — GLUCOSE, CAPILLARY
Glucose-Capillary: 105 mg/dL — ABNORMAL HIGH (ref 70–99)
Glucose-Capillary: 118 mg/dL — ABNORMAL HIGH (ref 70–99)

## 2024-10-02 MED ORDER — OXYCODONE HCL 5 MG PO TABS
5.0000 mg | ORAL_TABLET | Freq: Once | ORAL | Status: AC | PRN
  Administered 2024-10-02: 5 mg via ORAL

## 2024-10-02 MED ORDER — CEFAZOLIN SODIUM-DEXTROSE 2-4 GM/100ML-% IV SOLN
2.0000 g | INTRAVENOUS | Status: AC
  Administered 2024-10-02: 2 g via INTRAVENOUS
  Filled 2024-10-02: qty 100

## 2024-10-02 MED ORDER — FENTANYL CITRATE (PF) 100 MCG/2ML IJ SOLN
INTRAMUSCULAR | Status: AC
  Filled 2024-10-02: qty 2

## 2024-10-02 MED ORDER — LIDOCAINE HCL (PF) 2 % IJ SOLN
INTRAMUSCULAR | Status: DC | PRN
  Administered 2024-10-02: 100 mg via INTRADERMAL

## 2024-10-02 MED ORDER — FENTANYL CITRATE (PF) 100 MCG/2ML IJ SOLN
INTRAMUSCULAR | Status: DC | PRN
  Administered 2024-10-02: 100 ug via INTRAVENOUS
  Administered 2024-10-02: 50 ug via INTRAVENOUS

## 2024-10-02 MED ORDER — EPHEDRINE SULFATE (PRESSORS) 25 MG/5ML IV SOSY
PREFILLED_SYRINGE | INTRAVENOUS | Status: DC | PRN
  Administered 2024-10-02 (×2): 5 mg via INTRAVENOUS

## 2024-10-02 MED ORDER — FENTANYL CITRATE (PF) 50 MCG/ML IJ SOSY: 25.0000 ug | PREFILLED_SYRINGE | INTRAMUSCULAR | Status: DC | PRN

## 2024-10-02 MED ORDER — CELECOXIB 200 MG PO CAPS: 200.0000 mg | ORAL_CAPSULE | Freq: Once | ORAL | Status: DC

## 2024-10-02 MED ORDER — ONDANSETRON HCL 4 MG/2ML IJ SOLN
4.0000 mg | Freq: Once | INTRAMUSCULAR | Status: AC | PRN
  Administered 2024-10-02: 4 mg via INTRAVENOUS

## 2024-10-02 MED ORDER — INSULIN ASPART 100 UNIT/ML IJ SOLN: 0.0000 [IU] | INTRAMUSCULAR | Status: DC | PRN

## 2024-10-02 MED ORDER — CHLORHEXIDINE GLUCONATE 0.12 % MT SOLN
15.0000 mL | Freq: Once | OROMUCOSAL | Status: AC
  Administered 2024-10-02: 15 mL via OROMUCOSAL

## 2024-10-02 MED ORDER — PROPOFOL 10 MG/ML IV BOLUS
INTRAVENOUS | Status: AC
  Filled 2024-10-02: qty 20

## 2024-10-02 MED ORDER — ONDANSETRON HCL 4 MG/2ML IJ SOLN
INTRAMUSCULAR | Status: AC
  Filled 2024-10-02: qty 2

## 2024-10-02 MED ORDER — PROPOFOL 10 MG/ML IV BOLUS
INTRAVENOUS | Status: DC | PRN
  Administered 2024-10-02: 200 mg via INTRAVENOUS

## 2024-10-02 MED ORDER — OXYCODONE HCL 5 MG PO TABS
ORAL_TABLET | ORAL | Status: AC
  Filled 2024-10-02: qty 1

## 2024-10-02 MED ORDER — DEXAMETHASONE SOD PHOSPHATE PF 10 MG/ML IJ SOLN
INTRAMUSCULAR | Status: AC
  Filled 2024-10-02: qty 1

## 2024-10-02 MED ORDER — IOHEXOL 300 MG/ML  SOLN
INTRAMUSCULAR | Status: DC | PRN
  Administered 2024-10-02: 18 mL

## 2024-10-02 MED ORDER — OXYBUTYNIN CHLORIDE 5 MG PO TABS: 5.0000 mg | ORAL_TABLET | Freq: Three times a day (TID) | ORAL | 1 refills | Status: AC | PRN

## 2024-10-02 MED ORDER — MIDAZOLAM HCL 2 MG/2ML IJ SOLN
INTRAMUSCULAR | Status: AC
  Filled 2024-10-02: qty 2

## 2024-10-02 MED ORDER — ACETAMINOPHEN 500 MG PO TABS: 1000.0000 mg | ORAL_TABLET | Freq: Once | ORAL | Status: DC

## 2024-10-02 MED ORDER — ONDANSETRON HCL 4 MG/2ML IJ SOLN
INTRAMUSCULAR | Status: DC | PRN
  Administered 2024-10-02: 4 mg via INTRAVENOUS

## 2024-10-02 MED ORDER — MEPERIDINE HCL 25 MG/ML IJ SOLN: 6.2500 mg | INTRAMUSCULAR | Status: DC | PRN

## 2024-10-02 MED ORDER — LACTATED RINGERS IV SOLN: INTRAVENOUS | Status: DC

## 2024-10-02 MED ORDER — OXYCODONE HCL 5 MG/5ML PO SOLN: 5.0000 mg | Freq: Once | ORAL | Status: AC | PRN

## 2024-10-02 MED ORDER — DEXAMETHASONE SOD PHOSPHATE PF 10 MG/ML IJ SOLN
INTRAMUSCULAR | Status: DC | PRN
  Administered 2024-10-02: 8 mg via INTRAVENOUS

## 2024-10-02 MED ORDER — KETOROLAC TROMETHAMINE 10 MG PO TABS: 10.0000 mg | ORAL_TABLET | Freq: Four times a day (QID) | ORAL | 0 refills | Status: AC | PRN

## 2024-10-02 MED ORDER — PHENAZOPYRIDINE HCL 200 MG PO TABS: 200.0000 mg | ORAL_TABLET | Freq: Three times a day (TID) | ORAL | 0 refills | Status: AC | PRN

## 2024-10-02 MED ORDER — SODIUM CHLORIDE 0.9 % IR SOLN
Status: DC | PRN
  Administered 2024-10-02: 3000 mL via INTRAVESICAL

## 2024-10-02 MED ORDER — EPHEDRINE 5 MG/ML INJ
INTRAVENOUS | Status: AC
  Filled 2024-10-02: qty 5

## 2024-10-02 MED ORDER — ORAL CARE MOUTH RINSE: 15.0000 mL | Freq: Once | OROMUCOSAL | Status: AC

## 2024-10-02 NOTE — Transfer of Care (Signed)
 Immediate Anesthesia Transfer of Care Note  Patient: Amanda Kirk  Procedure(s) Performed: CYSTOSCOPY/URETEROSCOPY/HOLMIUM LASER/STENT PLACEMENT (Bilateral)  Patient Location: PACU  Anesthesia Type:General  Level of Consciousness: drowsy and patient cooperative  Airway & Oxygen Therapy: Patient Spontanous Breathing and Patient connected to face mask oxygen  Post-op Assessment: Report given to RN and Post -op Vital signs reviewed and stable  Post vital signs: Reviewed and stable  Last Vitals:  Vitals Value Taken Time  BP 150/93 10/02/24 13:31  Temp    Pulse 89 10/02/24 13:32  Resp    SpO2 100 % 10/02/24 13:32  Vitals shown include unfiled device data.  Last Pain:  Vitals:   10/02/24 1013  TempSrc: Oral  PainSc: 0-No pain         Complications: No notable events documented.

## 2024-10-02 NOTE — Op Note (Signed)
 Operative Note  Preoperative diagnosis:  1.  Right ureteral and bilateral renal stones  Postoperative diagnosis: 1.  Right ureteral and bilateral renal stones as  Procedure(s): 1.  Cystoscopy with bilateral ureteroscopy, holmium laser lithotripsy and bilateral JJ stent placement 2.  Bilateral straight pyelograms with intraoperative interpretation of fluoroscopic imaging  Surgeon: Lonni Han, MD  Assistants:  None  Anesthesia:  General  Complications:  None  EBL: Less than 5 mL  Specimens: 1.  None  Drains/Catheters: 1.  Bilateral 6 French, 24 cm JJ stents without tethers  Intraoperative findings:   Obstructing 6 mm right UPJ stone 6 mm right renal stone 2 left renal stones the largest of which measured approximately 8 mm No intravesical urethral abnormalities were seen Right retrograde pyelogram revealed a filling defect within the proximal aspects of the right ureter, consistent with the obstructing stone seen on recent cross-sectional imaging.  No other filling defects were seen involving the distal aspects of the right ureter or right renal pelvis. Left retrograde pyelogram revealed a solitary left collecting system with no filling defects or dilation involving the left ureter or left renal pelvis seen on retrograde pyelogram   Indication:  Amanda Kirk is a 54 y.o. female with an obstructing 6 mm right UPJ and bilateral renal stones.  She has been consented for the above procedures, voices understanding and wishes to proceed.  Description of procedure:  After informed consent was obtained, the patient was brought to the operating room and general LMA anesthesia was administered. The patient was then placed in the dorsolithotomy position and prepped and draped in the usual sterile fashion. A timeout was performed. A 21 French rigid cystoscope was then inserted into the urethral meatus and advanced into the bladder under direct vision. A complete bladder survey  revealed no intravesical pathology.  A 5 French ureteral catheter was then inserted into the right ureteral orifice and a retrograde pyelogram was obtained, with the findings listed above.  A Glidewire was then used to intubate the lumen of the ureteral catheter and was advanced up to the right renal pelvis, under fluoroscopic guidance.  The catheter was then removed, leaving the wire in place.  The cystoscope was then removed.  A flexible ureteroscope was then advanced up the right ureter to the level of the obstructing 6 mm stone.  The UPJ stone was then manipulated into a midpole calyx.  There was an additional upper pole stone measuring approximately 6 mm.  The 200 m holmium laser was then used to fracture all identifiable stone burden into less than 1 mm fragments.  The flexible ureteroscope was then removed, leaving the Glidewire in place.  A 6 French, 24 cm JJ stent was then advanced over the Glidewire and into good position within the right collecting system, confirming placement via fluoroscopy.  A 5 French ureteral catheter was then inserted into the left ureteral orifice and a retrograde pyelogram was obtained, with the findings listed above.  A Glidewire was then used to intubate the lumen of the ureteral catheter and was advanced up to the left renal pelvis, under fluoroscopic guidance.  The catheter was then removed, leaving the wire in place.  Flexible ureteroscope was then advanced up to the left renal pelvis where 2 lower pole stones were identified.  The 200 m holmium laser was then used to fracture all identifiable stone burden into less than 1 mm fragments.  The flexible ureteroscope was then removed, leaving the Glidewire in place.  A 6  French, 24 cm JJ stent was then advanced over the wire and into good position within the left collecting system, confirming placement via fluoroscopy.  The patient's bladder was then drained.  She tolerated the procedure well and was transferred to  the postanesthesia in stable condition.  Plan: Follow-up in 1 week for office cystoscopy and stent removal

## 2024-10-02 NOTE — Anesthesia Preprocedure Evaluation (Addendum)
"                                    Anesthesia Evaluation  Patient identified by MRN, date of birth, ID band Patient awake    Reviewed: Allergy & Precautions, H&P , NPO status , Patient's Chart, lab work & pertinent test results  Airway Mallampati: II  TM Distance: >3 FB Neck ROM: full    Dental no notable dental hx. (+) Poor Dentition, Missing, Dental Advisory Given,    Pulmonary neg pulmonary ROS   Pulmonary exam normal breath sounds clear to auscultation       Cardiovascular Exercise Tolerance: Good hypertension, Pt. on medications  Rhythm:regular Rate:Normal     Neuro/Psych negative neurological ROS  negative psych ROS   GI/Hepatic negative GI ROS, Neg liver ROS,,,  Endo/Other  diabetes    Renal/GU Renal disease  negative genitourinary   Musculoskeletal  (+) Arthritis , Osteoarthritis,    Abdominal   Peds  Hematology negative hematology ROS (+)   Anesthesia Other Findings   Reproductive/Obstetrics negative OB ROS                              Anesthesia Physical Anesthesia Plan  ASA: 3  Anesthesia Plan: General   Post-op Pain Management: Tylenol  PO (pre-op)* and Celebrex  PO (pre-op)*   Induction: Intravenous  PONV Risk Score and Plan: 3 and Ondansetron , Dexamethasone  and Treatment may vary due to age or medical condition  Airway Management Planned: Oral ETT and LMA  Additional Equipment: None  Intra-op Plan:   Post-operative Plan:   Informed Consent: I have reviewed the patients History and Physical, chart, labs and discussed the procedure including the risks, benefits and alternatives for the proposed anesthesia with the patient or authorized representative who has indicated his/her understanding and acceptance.     Dental Advisory Given and Dental advisory given  Plan Discussed with: CRNA and Anesthesiologist  Anesthesia Plan Comments:          Anesthesia Quick Evaluation  "

## 2024-10-02 NOTE — H&P (Signed)
 Urology Preoperative H&P   Chief Complaint: kidney stones   History of Present Illness: Amanda Kirk is a 54 y.o. female with who seen in the emergency department on 09/11/2024 as well as on 09/12/2024 for a 7-day history of right renal colic secondary to an obstructing 6 mm right UPJ stone. Bilateral renal stones noted on CT as well.  Minimal right hydro. Obstructing stone Hounsfield unit is approximately 1000. WBC-5.9. Serum creatinine-0.66. UA is positive for trace leukocytes and large blood with no evidence of acute cystitis. Patient states that she cannot tolerate ESWL due to cardiac arrythmias.  She is here today for definitive treatment of her bilateral stone burden.  She denies interval fever/chills, dysuria or hematuria.    Past Medical History:  Diagnosis Date   Arthritis    Calculus of gallbladder with acute cholecystitis, without mention of obstruction    Calculus of kidney    Diabetes mellitus without complication (HCC)    History of kidney stones    Hypertension    Kidney stones    Other chronic nonalcoholic liver disease     Past Surgical History:  Procedure Laterality Date   ABDOMINAL HYSTERECTOMY     CHOLECYSTECTOMY  07/24/2012   Procedure: LAPAROSCOPIC CHOLECYSTECTOMY WITH INTRAOPERATIVE CHOLANGIOGRAM;  Surgeon: Redell Faith, DO;  Location: [facility];  Service: General;  Laterality: N/A;   KNEE SURGERY     x 2    Allergies: Allergies[1]  Family History  Problem Relation Age of Onset   Hypertension Mother    Hypertension Father    Lupus Sister    Diabetes Brother    Stroke Maternal Grandmother    Hypertension Brother     Social History:  reports that she has never smoked. She has never used smokeless tobacco. She reports that she does not currently use alcohol. She reports that she does not currently use drugs after having used the following drugs: Marijuana.  ROS: A complete review of systems was performed.  All systems are negative except for pertinent  findings as noted.  Physical Exam:  Vital signs in last 24 hours: Temp:  [98.9 F (37.2 C)] 98.9 F (37.2 C) (01/07 1013) Pulse Rate:  [64] 64 (01/07 1013) Resp:  [16] 16 (01/07 1013) BP: (135)/(91) 135/91 (01/07 1013) SpO2:  [99 %] 99 % (01/07 1013) Weight:  [68 kg] 68 kg (01/07 1013) Constitutional:  Alert and oriented, No acute distress Cardiovascular: Regular rate and rhythm, No JVD Respiratory: Normal respiratory effort, Lungs clear bilaterally GI: Abdomen is soft, nontender, nondistended, no abdominal masses GU: No CVA tenderness Lymphatic: No lymphadenopathy Neurologic: Grossly intact, no focal deficits Psychiatric: Normal mood and affect  Laboratory Data:  No results for input(s): WBC, HGB, HCT, PLT in the last 72 hours.  No results for input(s): NA, K, CL, GLUCOSE, BUN, CALCIUM, CREATININE in the last 72 hours.  Invalid input(s): CO3   Results for orders placed or performed during the hospital encounter of 10/02/24 (from the past 24 hours)  Glucose, capillary     Status: Abnormal   Collection Time: 10/02/24 10:29 AM  Result Value Ref Range   Glucose-Capillary 105 (H) 70 - 99 mg/dL   No results found for this or any previous visit (from the past 240 hours).  Renal Function: No results for input(s): CREATININE in the last 168 hours. Estimated Creatinine Clearance: 77 mL/min (by C-G formula based on SCr of 0.66 mg/dL).  Radiologic Imaging: No results found.  I independently reviewed the above imaging studies.  Assessment and Plan Amanda Kirk is a 54 y.o. female with a 6 mm right UPJ stone along with bilateral nonobstructing renal stones  The risks, benefits and alternatives of cystoscopy with bilateral ureteroscopy, laser lithotripsy and ureteral stent placement was discussed the patient.  Risks included, but are not limited to: bleeding, urinary tract infection, ureteral injury/avulsion, ureteral stricture formation, retained stone  fragments, the possibility that multiple surgeries may be required to treat the stone(s), MI, stroke, PE and the inherent risks of general anesthesia.  The patient voices understanding and wishes to proceed.        Lonni Han, MD 10/02/2024, 10:34 AM  [facility] Pager: [phone]     [1]  Allergies Allergen Reactions   Codeine Nausea And Vomiting

## 2024-10-02 NOTE — Anesthesia Postprocedure Evaluation (Signed)
"   Anesthesia Post Note  Patient: Amanda Kirk  Procedure(s) Performed: CYSTOSCOPY/URETEROSCOPY/HOLMIUM LASER/STENT PLACEMENT (Bilateral)     Patient location during evaluation: PACU Anesthesia Type: General Level of consciousness: awake and alert Pain management: pain level controlled Vital Signs Assessment: post-procedure vital signs reviewed and stable Respiratory status: spontaneous breathing, nonlabored ventilation, respiratory function stable and patient connected to nasal cannula oxygen Cardiovascular status: blood pressure returned to baseline and stable Postop Assessment: no apparent nausea or vomiting Anesthetic complications: no   No notable events documented.  Last Vitals:  Vitals:   10/02/24 1400 10/02/24 1415  BP: (!) 147/88 (!) 147/91  Pulse: 100 77  Resp: 19 18  Temp: (!) 36.4 C (!) 36.4 C  SpO2: 99% 100%    Last Pain:  Vitals:   10/02/24 1415  TempSrc: Oral  PainSc: 2                  [name]      "

## 2024-10-03 ENCOUNTER — Encounter (HOSPITAL_COMMUNITY): Payer: Self-pay | Admitting: Urology
